# Patient Record
Sex: Female | Born: 1960 | Race: White | Hispanic: No | Marital: Married | Smoking: Never smoker
Health system: Southern US, Community
[De-identification: ages and names within clinical notes are randomized; demographics above are authoritative.]

## PROBLEM LIST (undated history)

## (undated) DIAGNOSIS — G4700 Insomnia, unspecified: Secondary | ICD-10-CM

## (undated) DIAGNOSIS — E559 Vitamin D deficiency, unspecified: Secondary | ICD-10-CM

## (undated) DIAGNOSIS — J309 Allergic rhinitis, unspecified: Secondary | ICD-10-CM

## (undated) DIAGNOSIS — M1990 Unspecified osteoarthritis, unspecified site: Secondary | ICD-10-CM

## (undated) DIAGNOSIS — R5383 Other fatigue: Secondary | ICD-10-CM

## (undated) DIAGNOSIS — K219 Gastro-esophageal reflux disease without esophagitis: Secondary | ICD-10-CM

## (undated) DIAGNOSIS — E66813 Obesity, class 3: Secondary | ICD-10-CM

## (undated) DIAGNOSIS — G43909 Migraine, unspecified, not intractable, without status migrainosus: Secondary | ICD-10-CM

## (undated) DIAGNOSIS — I499 Cardiac arrhythmia, unspecified: Secondary | ICD-10-CM

## (undated) DIAGNOSIS — I10 Essential (primary) hypertension: Secondary | ICD-10-CM

## (undated) DIAGNOSIS — J45909 Unspecified asthma, uncomplicated: Secondary | ICD-10-CM

## (undated) DIAGNOSIS — N1831 Chronic kidney disease, stage 3a: Secondary | ICD-10-CM

## (undated) DIAGNOSIS — Z6841 Body Mass Index (BMI) 40.0 and over, adult: Secondary | ICD-10-CM

## (undated) DIAGNOSIS — M109 Gout, unspecified: Secondary | ICD-10-CM

## (undated) HISTORY — DX: Unspecified asthma, uncomplicated: J45.909

## (undated) HISTORY — DX: Migraine, unspecified, not intractable, without status migrainosus: G43.909

## (undated) HISTORY — DX: Insomnia, unspecified: G47.00

## (undated) HISTORY — PX: BRAIN SURGERY: SHX531

## (undated) HISTORY — DX: Essential (primary) hypertension: I10

## (undated) HISTORY — DX: Cardiac arrhythmia, unspecified: I49.9

## (undated) HISTORY — DX: Gastro-esophageal reflux disease without esophagitis: K21.9

## (undated) HISTORY — DX: Vitamin D deficiency, unspecified: E55.9

## (undated) HISTORY — DX: Allergic rhinitis, unspecified: J30.9

## (undated) HISTORY — DX: Unspecified osteoarthritis, unspecified site: M19.90

---

## 2001-08-15 ENCOUNTER — Other Ambulatory Visit: Admission: RE | Admit: 2001-08-15 | Discharge: 2001-08-15 | Payer: Self-pay | Admitting: Family Medicine

## 2005-05-23 ENCOUNTER — Emergency Department: Payer: Self-pay | Admitting: Unknown Physician Specialty

## 2006-07-04 IMAGING — CT CT ABD-PELV W/O CM
1 of 2 series · 15 of 32 positions shown, 19 images · non-contrast
Comparison: none

REASON FOR EXAM: (1) pain and vomiting  rm 14; (2) pain and vomiting  rm 14
COMMENTS:

[Series 2: stone · axial · 0.77mm/px · z∈[-522,-114]mm · 15 of 153 slices shown, 19 images]
[im 11/153  soft-tissue]
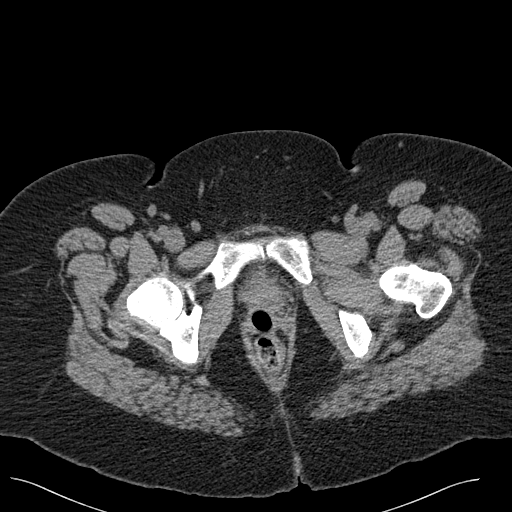
[im 11/153  bone]
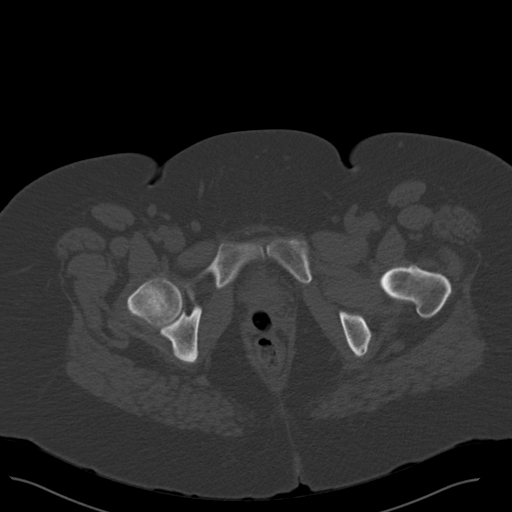
[im 22/153  soft-tissue]
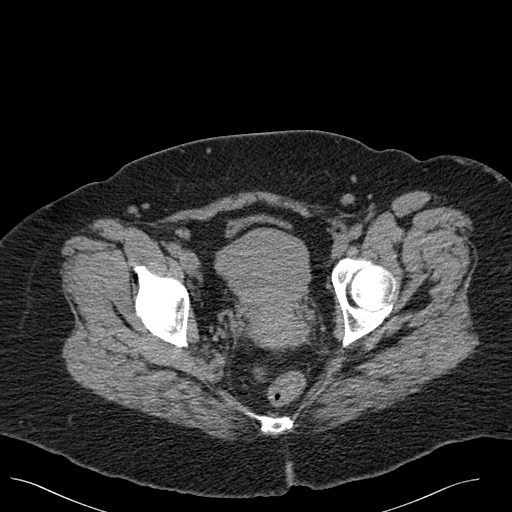
[im 33/153  soft-tissue]
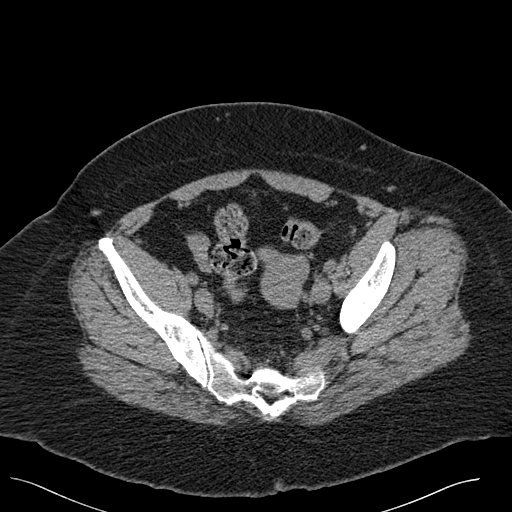
[im 44/153  soft-tissue]
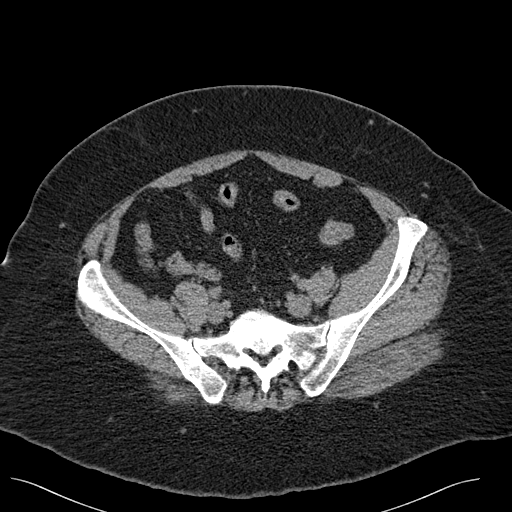
[im 55/153  soft-tissue]
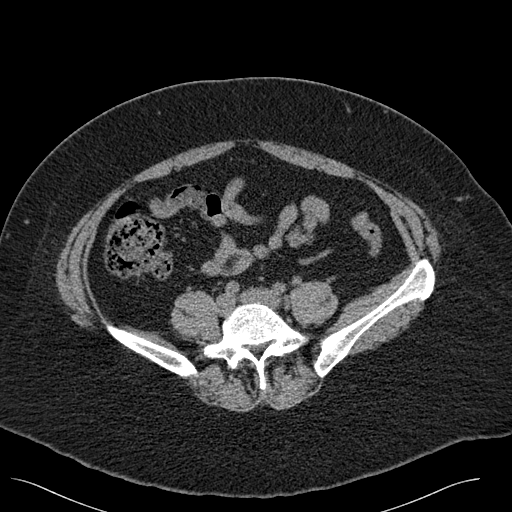
[im 66/153  soft-tissue]
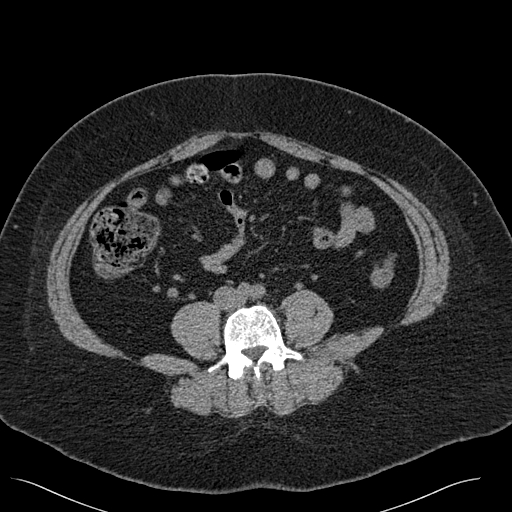
[im 77/153  soft-tissue]
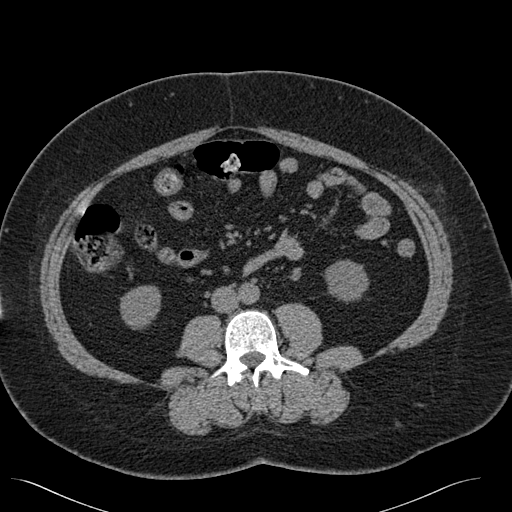
[im 87/153  soft-tissue]
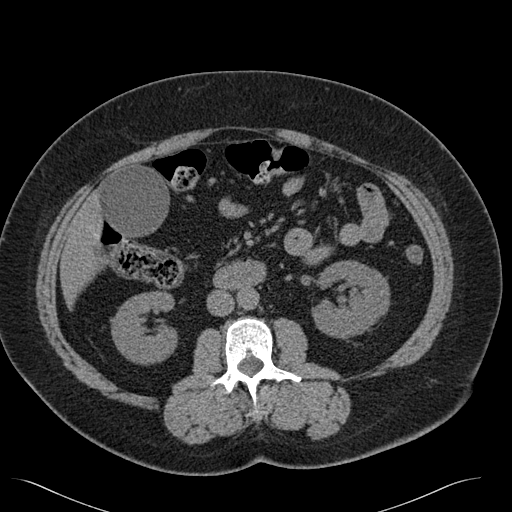
[im 98/153  soft-tissue]
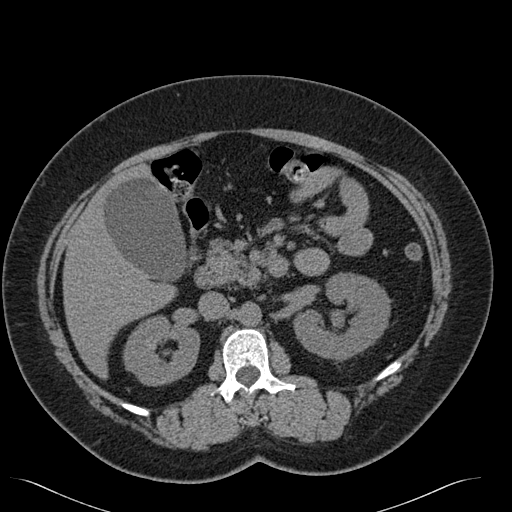
[im 98/153  bone]
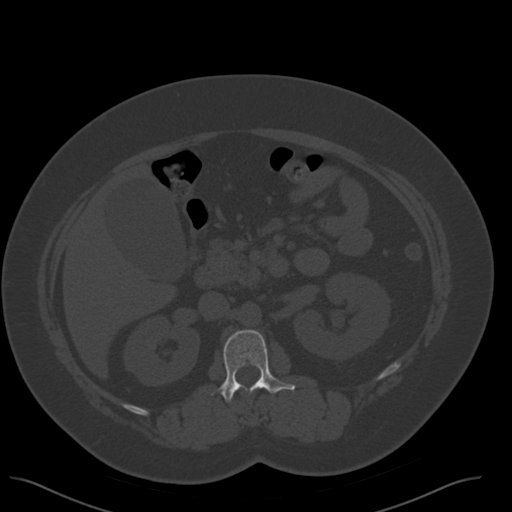
[im 109/153  soft-tissue]
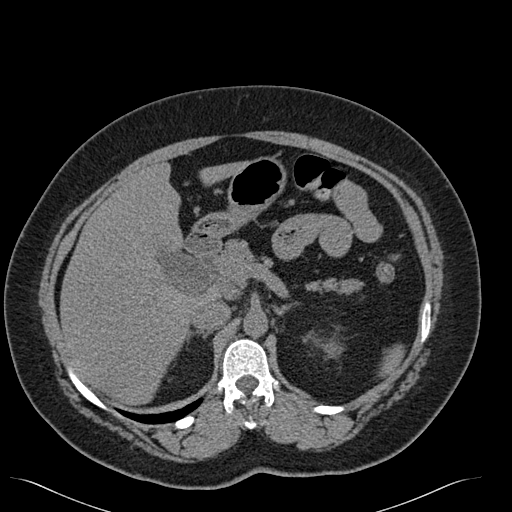
[im 120/153  soft-tissue]
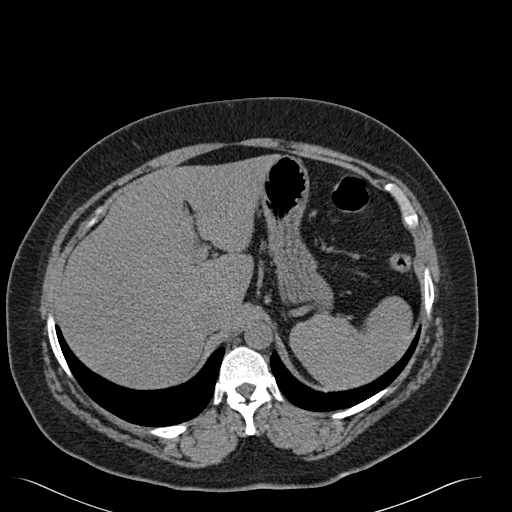
[im 131/153  soft-tissue]
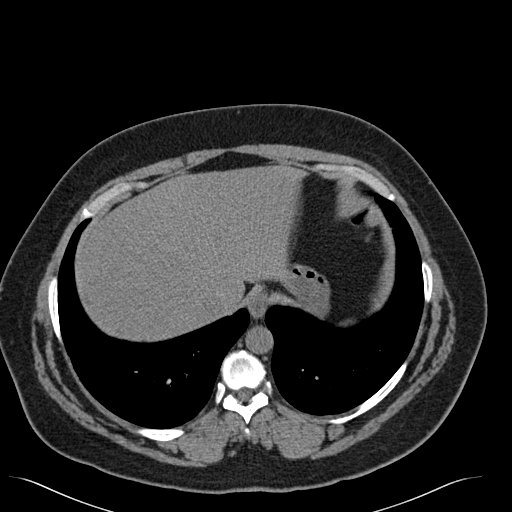
[im 131/153  lung]
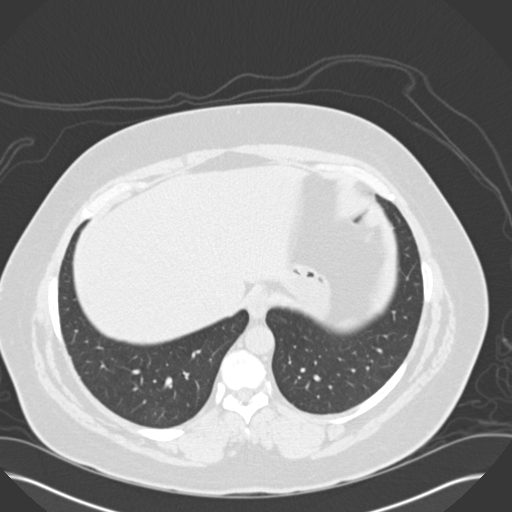
[im 136/153  lung]
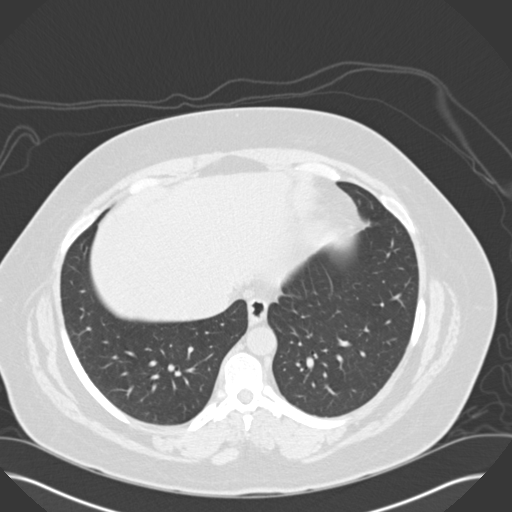
[im 142/153  soft-tissue]
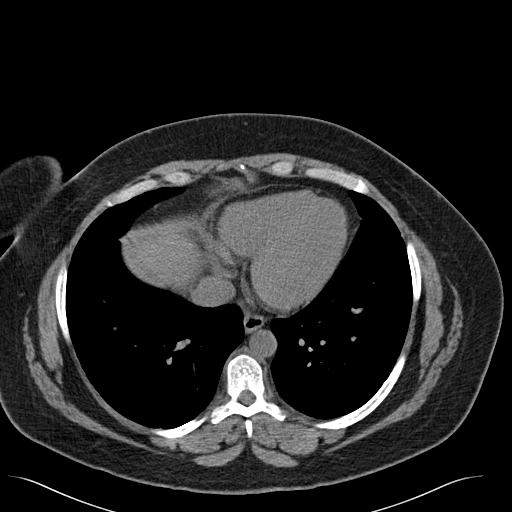
[im 142/153  lung]
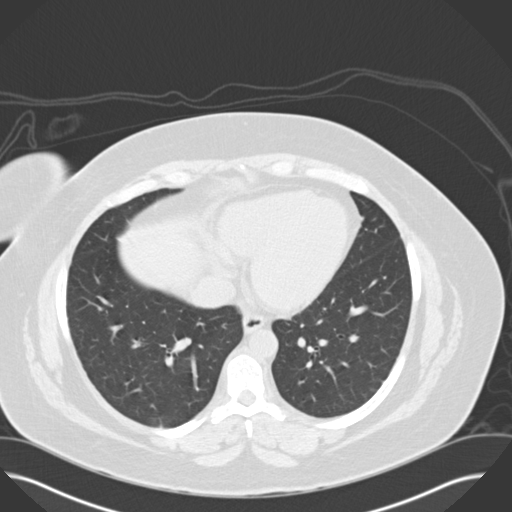
[im 147/153  lung]
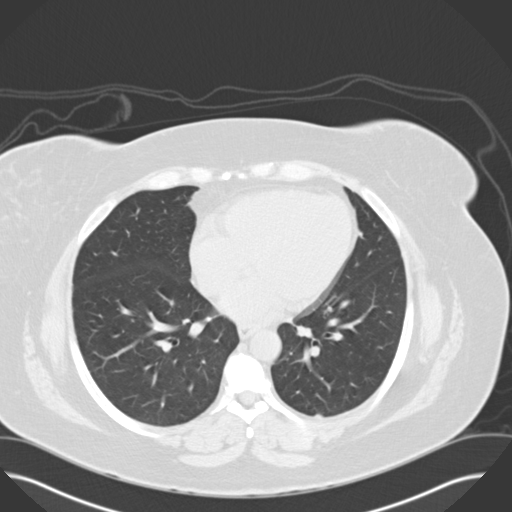

[15 of 32 positions shown; findings below may reference images not displayed]

PROCEDURE:     CT  - CT ABDOMEN AND PELVIS W[DATE]  [DATE]

RESULT:     The patient is being evaluated for possible urinary tract stones
on the LEFT.  The patient received no oral or IV contrast material.

The LEFT kidney exhibits mild hydronephrosis.  There is a tiny calcification
associated with the distal LEFT ureter seen best on image #135 and #136.
This may, in fact, reflect 2 tiny stones.  This stone lies approximately
cm proximal to the ureterovesical junction.  The RIGHT kidney exhibits no
hydronephrosis.

The liver, spleen, stomach, adrenal glands, and pancreas are normal in
appearance.  There is subtle radiodensity in the dependent portion of the
gallbladder which may reflect radiodense bile or noncalcified stones.  This
merits follow up.  The caliber of the abdominal aorta is normal.  The
unopacified loops of small and large bowel are normal in appearance.  The
uterus and adnexal structures are grossly normal.  The lung bases are clear.
IMPRESSION: 1.     There is mild hydronephrosis and hydroureter, secondary to at least
one tiny stone measuring no more than 2 mm in diameter in the distal ureter
just proximal to the ureterovesical junction.
2.     There are findings most compatible with noncalcified gallstones
present.  This could be verified with ultrasound.
3.     Not mentioned above is a hypodense focus in the mid pole of the LEFT
kidney, most compatible with an angiomyolipoma.
4.     A preliminary report was sent to the emergency room at the conclusion
of the study.

## 2011-06-26 ENCOUNTER — Ambulatory Visit: Payer: Self-pay | Admitting: Internal Medicine

## 2011-06-26 LAB — RAPID INFLUENZA A&B ANTIGENS

## 2011-07-03 ENCOUNTER — Ambulatory Visit: Payer: Self-pay | Admitting: Family Medicine

## 2011-09-13 ENCOUNTER — Ambulatory Visit: Payer: Self-pay | Admitting: Cardiovascular Disease

## 2013-05-29 ENCOUNTER — Ambulatory Visit: Payer: Self-pay | Admitting: Family Medicine

## 2014-11-30 IMAGING — CR DG CHEST 2V
1 series · 3 of 3 positions shown · non-contrast
Comparison: DG CHEST 2V dated 07/03/2011

CLINICAL DATA: Cough and short of breath.

EXAM:
CHEST  2 VIEW

[Series 1: pa · 0.17mm/px · 3 of 3 slices shown]
[im 1/3]
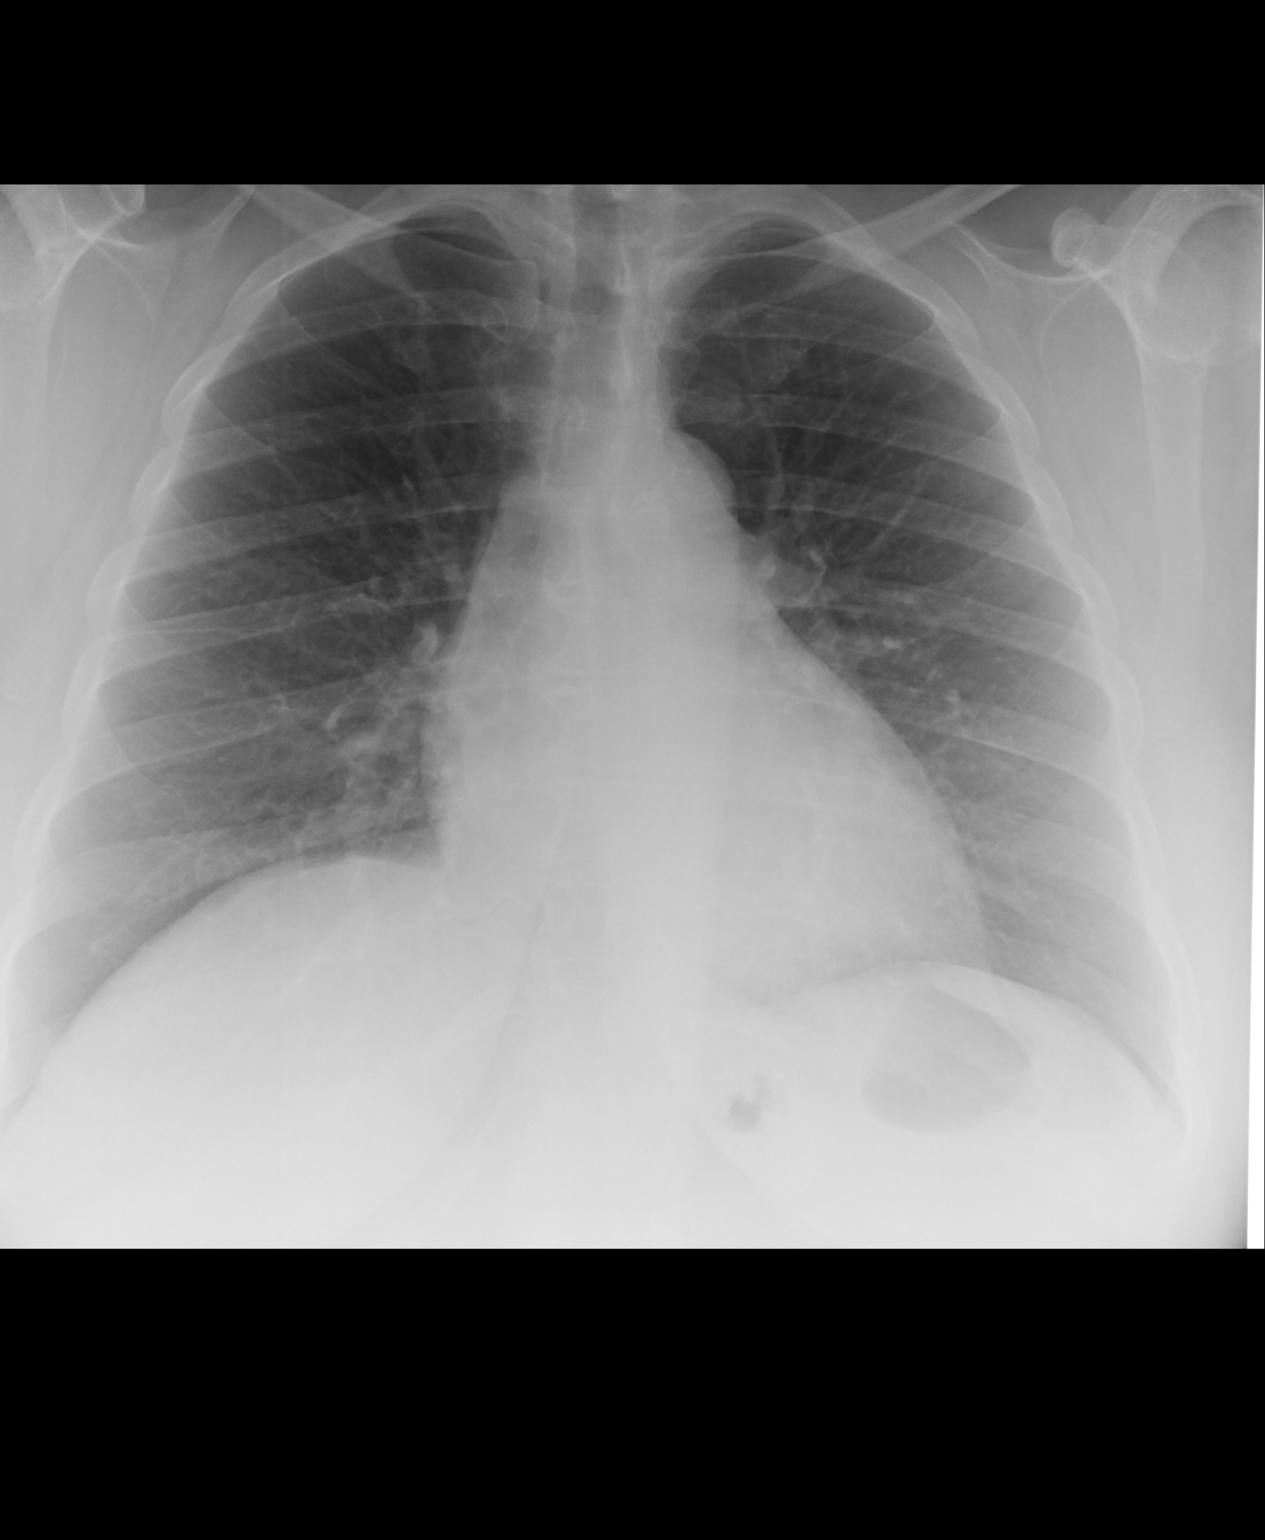
[im 2/3]
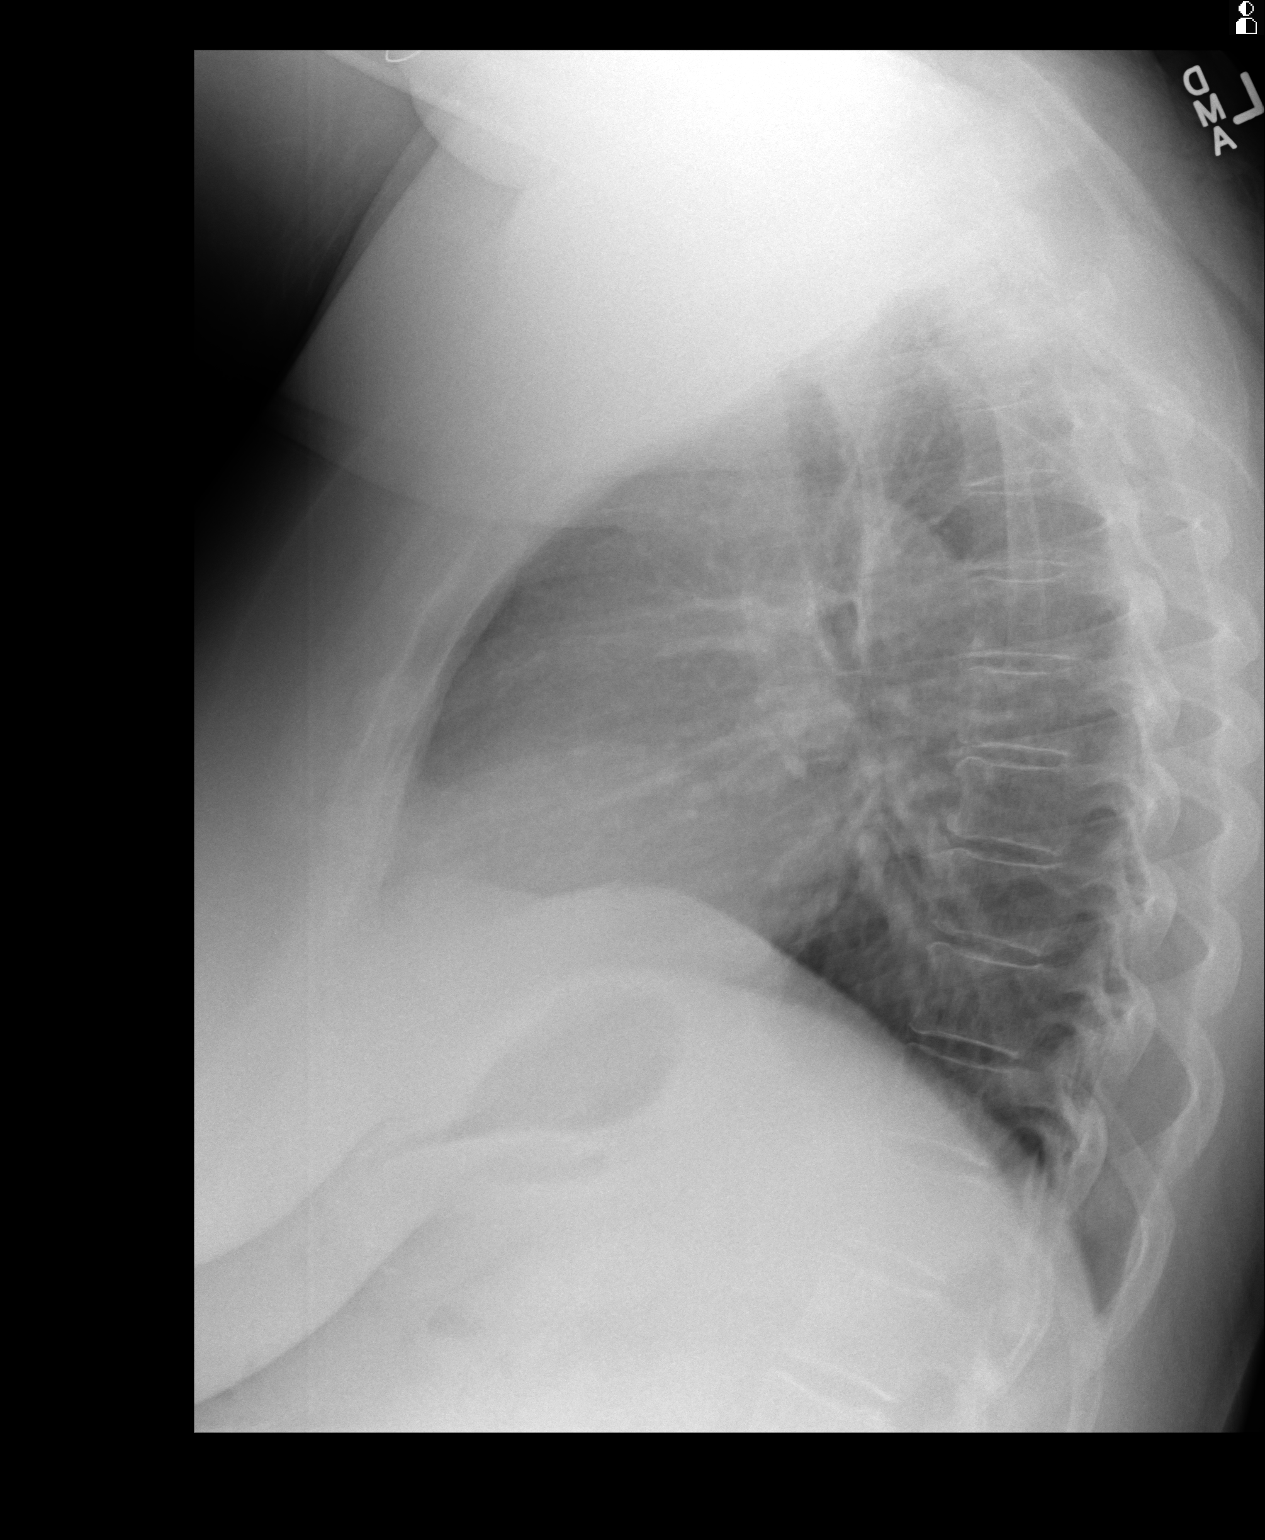
[im 3/3]
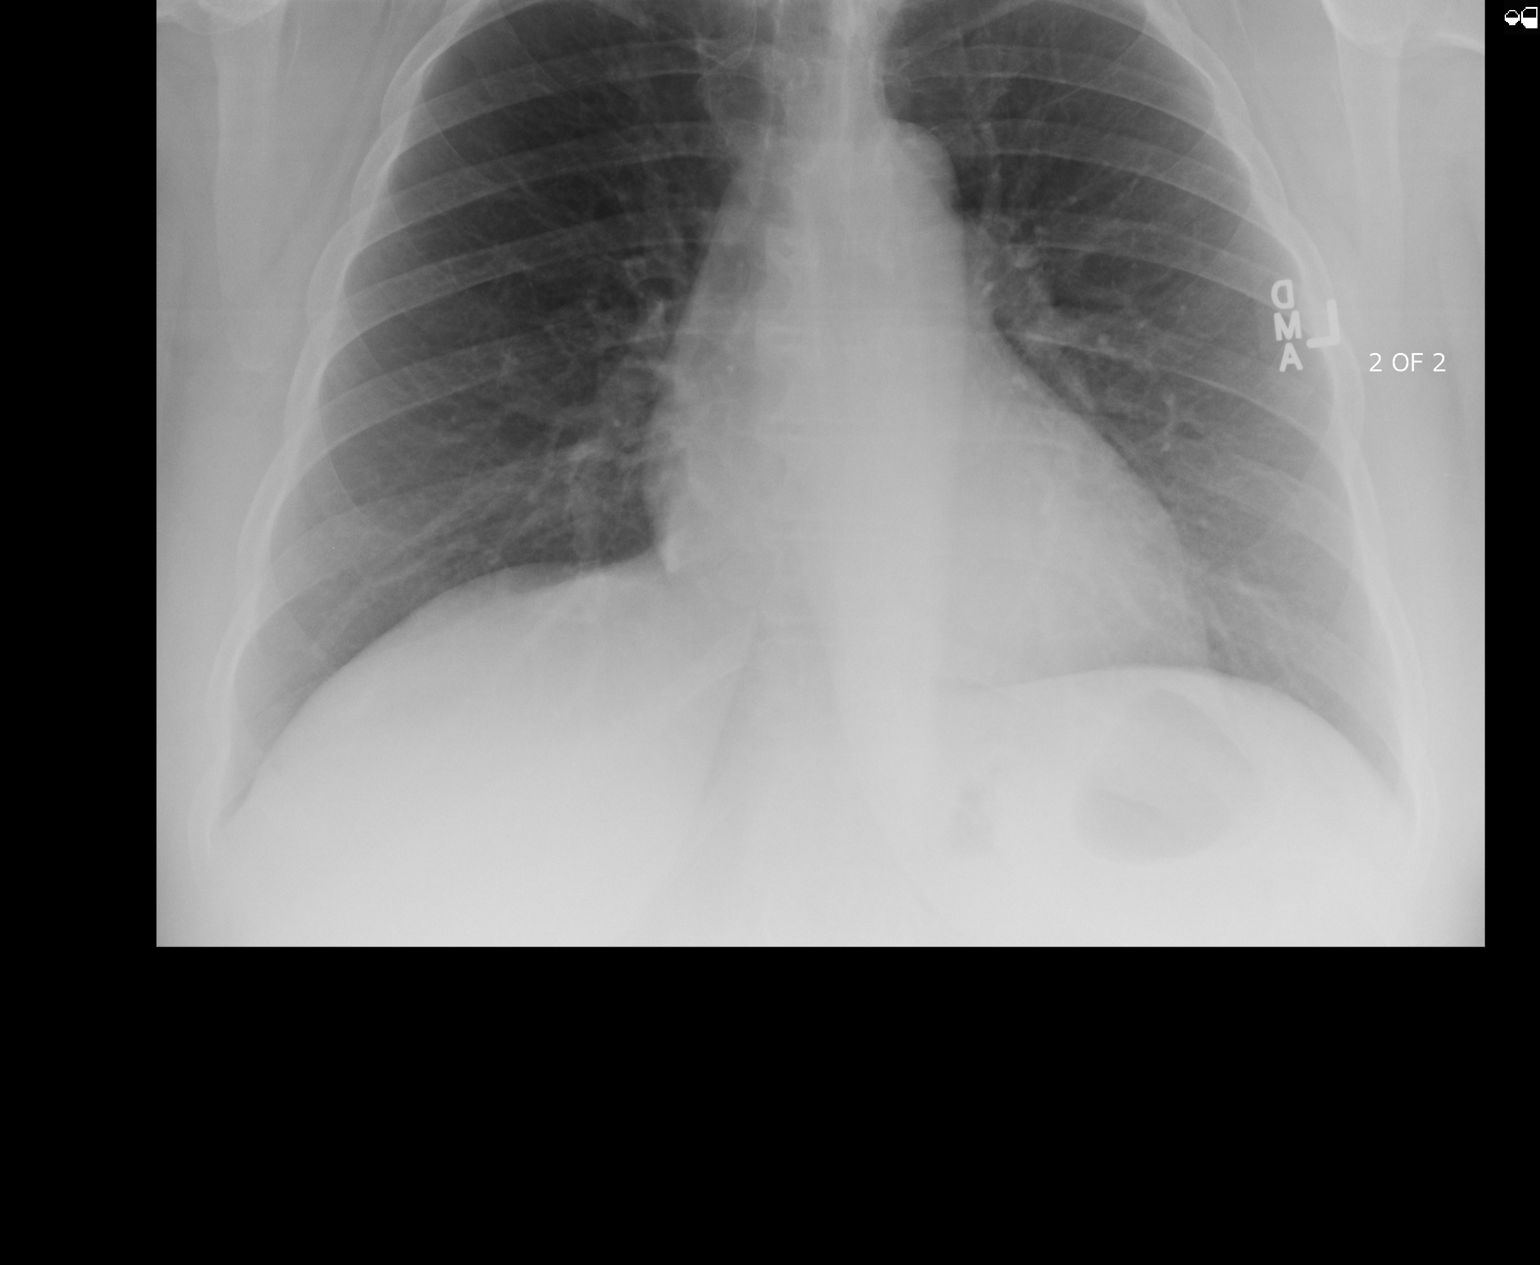

[3 of 3 positions shown; findings below may reference images not displayed]

FINDINGS: Cardiopericardial silhouette within normal limits. Mediastinal
contours normal. Trachea midline. No airspace disease or effusion.
IMPRESSION: No active cardiopulmonary disease.

## 2015-02-15 ENCOUNTER — Other Ambulatory Visit: Payer: Self-pay

## 2015-02-15 DIAGNOSIS — K219 Gastro-esophageal reflux disease without esophagitis: Secondary | ICD-10-CM | POA: Insufficient documentation

## 2015-02-15 DIAGNOSIS — I10 Essential (primary) hypertension: Secondary | ICD-10-CM | POA: Insufficient documentation

## 2015-02-15 MED ORDER — TRIAMTERENE-HCTZ 75-50 MG PO TABS: 1.0000 | ORAL_TABLET | Freq: Every day | ORAL | Status: DC

## 2015-02-15 NOTE — Telephone Encounter (Signed)
Last OV 07/2013  Thanks,   -Jakiah Goree  

## 2015-02-15 NOTE — Addendum Note (Signed)
Addended by: Kavin Leech E on: 02/15/2015 09:52 AM   Modules accepted: Orders

## 2015-02-17 ENCOUNTER — Other Ambulatory Visit: Payer: Self-pay

## 2015-02-17 DIAGNOSIS — I10 Essential (primary) hypertension: Secondary | ICD-10-CM

## 2015-02-17 MED ORDER — TRIAMTERENE-HCTZ 75-50 MG PO TABS: 1.0000 | ORAL_TABLET | Freq: Every day | ORAL | Status: DC

## 2015-05-31 ENCOUNTER — Ambulatory Visit (INDEPENDENT_AMBULATORY_CARE_PROVIDER_SITE_OTHER): Payer: BC Managed Care – PPO | Admitting: Family Medicine

## 2015-05-31 ENCOUNTER — Encounter: Payer: Self-pay | Admitting: Family Medicine

## 2015-05-31 VITALS — BP 128/108 | HR 98 | Temp 98.3°F | Resp 16 | Wt 247.2 lb

## 2015-05-31 DIAGNOSIS — I10 Essential (primary) hypertension: Secondary | ICD-10-CM | POA: Diagnosis not present

## 2015-05-31 DIAGNOSIS — J45909 Unspecified asthma, uncomplicated: Secondary | ICD-10-CM | POA: Insufficient documentation

## 2015-05-31 DIAGNOSIS — M1990 Unspecified osteoarthritis, unspecified site: Secondary | ICD-10-CM | POA: Insufficient documentation

## 2015-05-31 DIAGNOSIS — G4700 Insomnia, unspecified: Secondary | ICD-10-CM | POA: Insufficient documentation

## 2015-05-31 DIAGNOSIS — N912 Amenorrhea, unspecified: Secondary | ICD-10-CM | POA: Insufficient documentation

## 2015-05-31 DIAGNOSIS — E669 Obesity, unspecified: Secondary | ICD-10-CM

## 2015-05-31 DIAGNOSIS — J309 Allergic rhinitis, unspecified: Secondary | ICD-10-CM | POA: Insufficient documentation

## 2015-05-31 DIAGNOSIS — Z87442 Personal history of urinary calculi: Secondary | ICD-10-CM | POA: Insufficient documentation

## 2015-05-31 DIAGNOSIS — J029 Acute pharyngitis, unspecified: Secondary | ICD-10-CM

## 2015-05-31 DIAGNOSIS — B349 Viral infection, unspecified: Secondary | ICD-10-CM

## 2015-05-31 DIAGNOSIS — G43909 Migraine, unspecified, not intractable, without status migrainosus: Secondary | ICD-10-CM | POA: Insufficient documentation

## 2015-05-31 DIAGNOSIS — Z8679 Personal history of other diseases of the circulatory system: Secondary | ICD-10-CM | POA: Insufficient documentation

## 2015-05-31 LAB — POC INFLUENZA A&B (BINAX/QUICKVUE)
Influenza A, POC: NEGATIVE
Influenza B, POC: NEGATIVE

## 2015-05-31 LAB — POCT RAPID STREP A (OFFICE): Rapid Strep A Screen: NEGATIVE

## 2015-05-31 MED ORDER — TRIAMTERENE-HCTZ 75-50 MG PO TABS: 1.0000 | ORAL_TABLET | Freq: Every day | ORAL | Status: DC

## 2015-05-31 MED ORDER — HYDROCODONE-HOMATROPINE 5-1.5 MG/5ML PO SYRP: ORAL_SOLUTION | ORAL | Status: DC

## 2015-05-31 NOTE — Patient Instructions (Signed)
May use Mucinex expectorant, Claritin for nasal drip, saline nasal spray, and Delsym for cough syrup that is non-sedating.

## 2015-05-31 NOTE — Progress Notes (Signed)
Subjective:     Patient ID: Brittney Sullivan, female   DOB: Jan 09, 1961, 55 y.o.   MRN: [id number]  HPI  Chief Complaint  Patient presents with  . Cough    Patient comes in office today with concerns of cough and congestion, patient states symptoms started off as a sore throat . Patient reports fever, body aches, chills, decreased appetite, runny nose and soreness in her left ear. Patient has not taken any otc medication to relieve symptoms  States she had the flu vaccine this year. Reports compliance with her bp medication.   Review of Systems     Objective:   Physical Exam  Constitutional: She appears well-developed and well-nourished. No distress.  Ears: T.M's intact without inflammatio Throat: no tonsillar enlargement or exudate; mild erythema Neck: no cervical adenopathy Lungs: clear     Assessment:    1. Sore throat - POCT rapid strep A   2. Viral syndrome - POC Influenza A&B - HYDROcodone-homatropine (HYCODAN) 5-1.5 MG/5ML syrup; 5 ml 4-6 hours as needed for cough  Dispense: 240 mL; Refill: 0  3. Essential hypertension - triamterene-hydrochlorothiazide (MAXZIDE) 75-50 MG tablet; Take 1 tablet by mouth daily.  Dispense: 30 tablet; Refill: 0    Plan:    work excuse for 1/17-1/20. Discussed otc medication that won't affect her bp. F/u htn in 3 weeks.

## 2015-06-24 ENCOUNTER — Encounter: Payer: Self-pay | Admitting: Family Medicine

## 2015-06-24 ENCOUNTER — Ambulatory Visit (INDEPENDENT_AMBULATORY_CARE_PROVIDER_SITE_OTHER): Payer: BC Managed Care – PPO | Admitting: Family Medicine

## 2015-06-24 VITALS — BP 102/86 | HR 86 | Temp 97.8°F | Resp 16 | Wt 250.6 lb

## 2015-06-24 DIAGNOSIS — I10 Essential (primary) hypertension: Secondary | ICD-10-CM

## 2015-06-24 DIAGNOSIS — E669 Obesity, unspecified: Secondary | ICD-10-CM | POA: Diagnosis not present

## 2015-06-24 DIAGNOSIS — Z13220 Encounter for screening for lipoid disorders: Secondary | ICD-10-CM | POA: Diagnosis not present

## 2015-06-24 MED ORDER — TRIAMTERENE-HCTZ 75-50 MG PO TABS: 1.0000 | ORAL_TABLET | Freq: Every day | ORAL | Status: DC

## 2015-06-24 MED ORDER — PHENTERMINE HCL 37.5 MG PO CAPS: 37.5000 mg | ORAL_CAPSULE | ORAL | Status: DC

## 2015-06-24 NOTE — Patient Instructions (Signed)
Don't forget to get your fasting labs drawn next week.

## 2015-06-24 NOTE — Progress Notes (Signed)
Subjective:     Patient ID: Brittney Sullivan, female   DOB: 1960-07-06, 55 y.o.   MRN: [id number]  HPI  Chief Complaint  Patient presents with  . Hypertension    Patient is present in office today for her 3 week follow up. Last office visit 05/31/15 patient was place on Maxide 75-50mg  qd, blood pressure in house at visit was 128/108. Patient reports good compliance, tolerance and symptom control while on medication.   Also wishes medication to help with weight loss. Current exercise is to walk all day in her job at [employer]. Has been on appetite suppressants years ago.   Review of Systems  Respiratory: Negative for shortness of breath.   Cardiovascular: Negative for chest pain and palpitations.       Objective:   Physical Exam  Constitutional: She appears well-developed and well-nourished. No distress.  Cardiovascular: Normal rate and regular rhythm.   Pulmonary/Chest: Breath sounds normal.  Musculoskeletal: She exhibits no edema (of lower extremities).       Assessment:    1. Essential hypertension - Comprehensive metabolic panel - triamterene-hydrochlorothiazide (MAXZIDE) 75-50 MG tablet; Take 1 tablet by mouth daily.  Dispense: 90 tablet; Refill: 3  2. Screening for cholesterol level - Lipid panel  3. Obesit - phentermine 37.5 MG capsule; Take 1 capsule (37.5 mg total) by mouth every morning.  Dispense: 30 capsule; Refill: 0    Plan:    F/u in 4 weeks/labs pending.

## 2015-06-30 ENCOUNTER — Telehealth: Payer: Self-pay

## 2015-06-30 LAB — COMPREHENSIVE METABOLIC PANEL
ALBUMIN: 4.4 g/dL (ref 3.5–5.5)
ALK PHOS: 96 IU/L (ref 39–117)
ALT: 19 IU/L (ref 0–32)
AST: 17 IU/L (ref 0–40)
Albumin/Globulin Ratio: 1.3 (ref 1.1–2.5)
BUN / CREAT RATIO: 18 (ref 9–23)
BUN: 16 mg/dL (ref 6–24)
Bilirubin Total: 0.4 mg/dL (ref 0.0–1.2)
CO2: 26 mmol/L (ref 18–29)
CREATININE: 0.89 mg/dL (ref 0.57–1.00)
Calcium: 10 mg/dL (ref 8.7–10.2)
Chloride: 100 mmol/L (ref 96–106)
GFR calc Af Amer: 85 mL/min/{1.73_m2} (ref >59)
GFR calc non Af Amer: 74 mL/min/{1.73_m2} (ref >59)
GLUCOSE: 101 mg/dL — AB (ref 65–99)
Globulin, Total: 3.4 g/dL (ref 1.5–4.5)
Potassium: 4.4 mmol/L (ref 3.5–5.2)
Sodium: 141 mmol/L (ref 134–144)
TOTAL PROTEIN: 7.8 g/dL (ref 6.0–8.5)

## 2015-06-30 LAB — LIPID PANEL
CHOLESTEROL TOTAL: 167 mg/dL (ref 100–199)
Chol/HDL Ratio: 2.5 ratio units (ref 0.0–4.4)
HDL: 66 mg/dL (ref >39)
LDL CALC: 84 mg/dL (ref 0–99)
Triglycerides: 85 mg/dL (ref 0–149)
VLDL CHOLESTEROL CAL: 17 mg/dL (ref 5–40)

## 2015-06-30 NOTE — Telephone Encounter (Signed)
[id number] 

## 2015-06-30 NOTE — Telephone Encounter (Signed)
-----   Message from Anola Gurney, [place] sent at 06/30/2015  7:04 AM EST ----- Labs look good except for minimal elevation of sugar. This should resolve with increased exercise and weight loss.

## 2015-07-11 NOTE — Telephone Encounter (Signed)
[id number] 

## 2015-07-13 NOTE — Telephone Encounter (Signed)
[id number] 

## 2015-07-18 NOTE — Telephone Encounter (Signed)
[id number] 

## 2015-07-20 NOTE — Telephone Encounter (Signed)
Patient has not returned call after multiple attempts, will mail letter home. KW

## 2015-07-22 ENCOUNTER — Ambulatory Visit (INDEPENDENT_AMBULATORY_CARE_PROVIDER_SITE_OTHER): Payer: BC Managed Care – PPO | Admitting: Family Medicine

## 2015-07-22 ENCOUNTER — Encounter: Payer: Self-pay | Admitting: Family Medicine

## 2015-07-22 VITALS — BP 132/72 | HR 76 | Temp 98.2°F | Resp 16 | Wt 244.8 lb

## 2015-07-22 DIAGNOSIS — E669 Obesity, unspecified: Secondary | ICD-10-CM | POA: Diagnosis not present

## 2015-07-22 DIAGNOSIS — F439 Reaction to severe stress, unspecified: Secondary | ICD-10-CM

## 2015-07-22 DIAGNOSIS — Z658 Other specified problems related to psychosocial circumstances: Secondary | ICD-10-CM | POA: Diagnosis not present

## 2015-07-22 MED ORDER — CLONAZEPAM 0.5 MG PO TABS
ORAL_TABLET | ORAL | Status: DC
Start: 2015-07-22 — End: 2019-01-08

## 2015-07-22 MED ORDER — PHENTERMINE HCL 37.5 MG PO CAPS: 37.5000 mg | ORAL_CAPSULE | ORAL | Status: DC

## 2015-07-22 NOTE — Progress Notes (Signed)
Subjective:     Patient ID: Brittney Sullivan, female   DOB: 1960/11/03, 55 y.o.   MRN: [id number]  HPI  Chief Complaint  Patient presents with  . Hypertension    Follow up from 06/24/15 at last office visit blood pressure in house was 102/86 patient was advised to continue Triamterene-HCTZ 75/50mg . Patient reports good compliance,tolerance and symptom control  . Obesity    Patient returns back to office for follow up, last office visit was 06/24/15 patient was started on Phentermine 37.5mg . Patient reports good compliance only side effect she complains of is dry mouth  . Insomnia    Patient reports that she has been under a lot of stress at work with new boss and states that she is having a hard time sleeping. Patient reports that she sleeps on avg <5hrs a night  Wishes to stay on phentermine despite dry mouth. Weight decreased 6#. States she walks a lot at work and is doing a remodeling project at home for her current exercise level. States "I can't please my boss." Has been on Xanax in the past at the time of a family member's death.   Review of Systems     Objective:   Physical Exam  Constitutional: She appears well-developed and well-nourished. No distress.  Psychiatric: She has a normal mood and affect. Her behavior is normal.       Assessment:    1. Obesity - phentermine 37.5 MG capsule; Take 1 capsule (37.5 mg total) by mouth every morning.  Dispense: 30 capsule; Refill: 0  2. Situational stress - clonazePAM (KLONOPIN) 0.5 MG tablet; Take one or two twice daily as needed for stress/insomnia  Dispense: 30 tablet; Refill: 1    Plan:   Discussed exercise and sleep promotion tips.

## 2015-07-22 NOTE — Patient Instructions (Addendum)
Continue with exercise efforts. Try 30 minutes of continuous walking daily.1.Keep the same bedtime every night 2.Use your bed for sleep and sex only 3. Avoid exercise or stimulating electronic activity (video games/smart phones) prior to bedtime 4.Avoid alcohol or caffeine 2-3 hours before bedtime 5.Take a bath/shower prior to bedtime. This will help cool your body down and prepare it for sleep. 6.If you can't sleep, get up and do a sleep inducing activity like reading,

## 2015-08-19 ENCOUNTER — Ambulatory Visit (INDEPENDENT_AMBULATORY_CARE_PROVIDER_SITE_OTHER): Payer: BC Managed Care – PPO | Admitting: Family Medicine

## 2015-08-19 ENCOUNTER — Encounter: Payer: Self-pay | Admitting: Family Medicine

## 2015-08-19 VITALS — BP 128/88 | HR 88 | Temp 98.0°F | Resp 18 | Ht 60.0 in | Wt 246.0 lb

## 2015-08-19 DIAGNOSIS — E669 Obesity, unspecified: Secondary | ICD-10-CM

## 2015-08-19 MED ORDER — PHENTERMINE HCL 37.5 MG PO CAPS: 37.5000 mg | ORAL_CAPSULE | ORAL | Status: DC

## 2015-08-19 NOTE — Patient Instructions (Signed)
Try to start a walking program of 30 minutes daily.

## 2015-08-19 NOTE — Progress Notes (Signed)
Subjective:     Patient ID: Brittney Sullivan, female   DOB: July 20, 1960, 55 y.o.   MRN: [id number]  HPI  Chief Complaint  Patient presents with  . Obesity  States she is not sure why her weight is up 2# from last month. She states she does not eat much. Continues to have job stress and feels the clonazepam at a whole pill fatigued her. Encouraged her to try 1/2 pill. She is completing a home remodeling project in the next week and should have more time for exercise.   Review of Systems  Respiratory: Negative for shortness of breath.   Cardiovascular: Negative for chest pain and palpitations.       Objective:   Physical Exam  Constitutional: She appears well-developed and well-nourished. No distress.  Psychiatric: She has a normal mood and affect. Her behavior is normal.       Assessment:    1. Obesity - phentermine 37.5 MG capsule; Take 1 capsule (37.5 mg total) by mouth every morning.  Dispense: 30 capsule; Refill: 0    Plan:    Start walking program 30 minutes daily. Consider trial on different appetite suppressant.

## 2015-09-16 ENCOUNTER — Ambulatory Visit: Payer: BC Managed Care – PPO | Admitting: Family Medicine

## 2018-03-13 ENCOUNTER — Encounter: Payer: Self-pay | Admitting: Family Medicine

## 2018-03-13 ENCOUNTER — Ambulatory Visit (INDEPENDENT_AMBULATORY_CARE_PROVIDER_SITE_OTHER): Payer: BC Managed Care – PPO | Admitting: Family Medicine

## 2018-03-13 VITALS — BP 158/90 | HR 83 | Temp 97.8°F | Resp 16 | Wt 263.2 lb

## 2018-03-13 DIAGNOSIS — I10 Essential (primary) hypertension: Secondary | ICD-10-CM

## 2018-03-13 DIAGNOSIS — H1032 Unspecified acute conjunctivitis, left eye: Secondary | ICD-10-CM

## 2018-03-13 MED ORDER — SULFACETAMIDE SODIUM 10 % OP SOLN: OPHTHALMIC | 0 refills | Status: DC

## 2018-03-13 MED ORDER — TRIAMTERENE-HCTZ 75-50 MG PO TABS: 1.0000 | ORAL_TABLET | Freq: Every day | ORAL | 0 refills | Status: DC

## 2018-03-13 NOTE — Patient Instructions (Addendum)
Discusses use of warm compresses several x day to left eye. Resume bp medication.

## 2018-03-13 NOTE — Progress Notes (Addendum)
Subjective:     Patient ID: Waldon Reining, female   DOB: 04-Jul-1960, 57 y.o.   MRN: [id number] Chief Complaint  Patient presents with  . Eye Drainage    Patient c/o left eye drainage since Saturday. She reports on Saturday it was pink. She woke up this morning and was not able to open her eye.She has cleaned it with warmed watch cloth.   HPI States vision is mildly blurry and her eye is sore and sensitive to light. No contact use. Also lost to f/u for HTN. She has been off medication for a year. Willing to resume medication. 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  Review of Systems     Objective:   Physical Exam  Constitutional: She appears well-developed and well-nourished. No distress.  HENT:  Left sclera injected with small amount of discharge @ medial canthus. Mild swelling of upper eyelid. V.A. Is intact ot # of fingers.  Eyes: Pupils are equal, round, and reactive to light.       Assessment:    1. Essential hypertension - triamterene-hydrochlorothiazide (MAXZIDE) 75-50 MG tablet; Take 1 tablet by mouth daily.  Dispense: 90 tablet; Refill: 0  2. Acute bacterial conjunctivitis of left eye - sulfacetamide (BLEPH-10) 10 % ophthalmic solution; Two drops in left eye 4 x day.  Dispense: 15 mL; Refill: 0    Plan:    Discussed use of warm compresses. F/u bp after her Thanksgiving vacation.

## 2018-04-18 ENCOUNTER — Ambulatory Visit: Payer: BC Managed Care – PPO | Admitting: Family Medicine

## 2018-04-18 ENCOUNTER — Encounter: Payer: Self-pay | Admitting: Family Medicine

## 2018-04-18 VITALS — BP 140/100 | HR 110 | Temp 98.1°F | Ht 60.0 in | Wt 261.8 lb

## 2018-04-18 DIAGNOSIS — Z13220 Encounter for screening for lipoid disorders: Secondary | ICD-10-CM

## 2018-04-18 DIAGNOSIS — I10 Essential (primary) hypertension: Secondary | ICD-10-CM | POA: Diagnosis not present

## 2018-04-18 MED ORDER — LISINOPRIL 10 MG PO TABS: 10.0000 mg | ORAL_TABLET | Freq: Every day | ORAL | 0 refills | Status: DC

## 2018-04-18 NOTE — Patient Instructions (Addendum)
Please get the labs fasting and we will cal with the results. Consider  scheduling a mammogram this year and update your pap smear next year.

## 2018-04-18 NOTE — Progress Notes (Signed)
Subjective:     Patient ID: Brittney Sullivan, female   DOB: Sep 28, 1960, 57 y.o.   MRN: [id number] Chief Complaint  Patient presents with  . Hypertension    5 week fup   HPI Reports compliance with HCTZ. She usually gets mammograms at [facility] and encouraged her to update.  Pap is due next year.  Review of Systems     Objective:   Physical Exam  Constitutional: She appears well-developed and well-nourished. No distress.  Cardiovascular: Normal rate and regular rhythm.  Pulmonary/Chest: Breath sounds normal.  Musculoskeletal: She exhibits no edema (of distal lower extremities).       Assessment:    1. Essential hypertension - lisinopril (PRINIVIL,ZESTRIL) 10 MG tablet; Take 1 tablet (10 mg total) by mouth daily.  Dispense: 30 tablet; Refill: 0 - Comprehensive metabolic panel  2. Screening for cholesterol level - Lipid panel    Plan:    Will call with lab results. Recheck in 3 weeks.

## 2018-04-23 LAB — COMPREHENSIVE METABOLIC PANEL
A/G RATIO: 1.3 (ref 1.2–2.2)
ALBUMIN: 4.4 g/dL (ref 3.5–5.5)
ALK PHOS: 98 IU/L (ref 39–117)
ALT: 20 IU/L (ref 0–32)
AST: 16 IU/L (ref 0–40)
BUN/Creatinine Ratio: 15 (ref 9–23)
BUN: 21 mg/dL (ref 6–24)
Bilirubin Total: 0.4 mg/dL (ref 0.0–1.2)
CALCIUM: 9.8 mg/dL (ref 8.7–10.2)
CHLORIDE: 100 mmol/L (ref 96–106)
CO2: 22 mmol/L (ref 20–29)
Creatinine, Ser: 1.39 mg/dL — ABNORMAL HIGH (ref 0.57–1.00)
GFR calc Af Amer: 49 mL/min/{1.73_m2} — ABNORMAL LOW (ref >59)
GFR calc non Af Amer: 42 mL/min/{1.73_m2} — ABNORMAL LOW (ref >59)
GLUCOSE: 109 mg/dL — AB (ref 65–99)
Globulin, Total: 3.5 g/dL (ref 1.5–4.5)
POTASSIUM: 4.8 mmol/L (ref 3.5–5.2)
Sodium: 140 mmol/L (ref 134–144)
Total Protein: 7.9 g/dL (ref 6.0–8.5)

## 2018-04-23 LAB — LIPID PANEL
CHOLESTEROL TOTAL: 160 mg/dL (ref 100–199)
Chol/HDL Ratio: 3.6 ratio (ref 0.0–4.4)
HDL: 44 mg/dL (ref >39)
LDL Calculated: 95 mg/dL (ref 0–99)
TRIGLYCERIDES: 104 mg/dL (ref 0–149)
VLDL Cholesterol Cal: 21 mg/dL (ref 5–40)

## 2018-05-09 ENCOUNTER — Other Ambulatory Visit: Payer: Self-pay

## 2018-05-09 ENCOUNTER — Encounter: Payer: Self-pay | Admitting: Family Medicine

## 2018-05-09 ENCOUNTER — Ambulatory Visit: Payer: BC Managed Care – PPO | Admitting: Family Medicine

## 2018-05-09 VITALS — BP 132/84 | HR 80 | Temp 97.8°F | Ht 60.0 in | Wt 262.0 lb

## 2018-05-09 DIAGNOSIS — S29012A Strain of muscle and tendon of back wall of thorax, initial encounter: Secondary | ICD-10-CM | POA: Diagnosis not present

## 2018-05-09 DIAGNOSIS — R944 Abnormal results of kidney function studies: Secondary | ICD-10-CM | POA: Diagnosis not present

## 2018-05-09 DIAGNOSIS — I10 Essential (primary) hypertension: Secondary | ICD-10-CM

## 2018-05-09 MED ORDER — CYCLOBENZAPRINE HCL 5 MG PO TABS: 5.0000 mg | ORAL_TABLET | Freq: Three times a day (TID) | ORAL | 0 refills | Status: DC | PRN

## 2018-05-09 MED ORDER — LISINOPRIL 10 MG PO TABS: 10.0000 mg | ORAL_TABLET | Freq: Every day | ORAL | 3 refills | Status: DC

## 2018-05-09 MED ORDER — TRIAMTERENE-HCTZ 75-50 MG PO TABS: 1.0000 | ORAL_TABLET | Freq: Every day | ORAL | 1 refills | Status: DC

## 2018-05-09 NOTE — Patient Instructions (Signed)
Discussed use of Tylenol up to 3000 mg/day for back discomfort. Get the labs in 3 months (March)

## 2018-05-09 NOTE — Progress Notes (Signed)
Subjective:     Patient ID: Brittney Sullivan, female   DOB: 1961/01/06, 57 y.o.   MRN: [id number] Chief Complaint  Patient presents with  . Follow-up    3 week blood pressure  . Spasms    right upper back since 05/06/18   HPI States she has been compliant and tolerating her bp medication. Reports 12/24 she developed right upper back spasms.Denies specific injury but was standing and cooking a lot over the holidays.  Review of Systems     Objective:   Physical Exam Constitutional:      General: She is not in acute distress. Cardiovascular:     Rate and Rhythm: Normal rate and regular rhythm.  Pulmonary:     Effort: No respiratory distress.     Breath sounds: Normal breath sounds.  Musculoskeletal:     Right lower leg: No edema.     Left lower leg: No edema.     Comments: Muscle strength in lower extremities 5/5. SLR's to 90 degrees without radiation of back pain. Mild tenderness over her right scapular area. No rash.  Neurological:     Mental Status: She is alert.        Assessment:    1. Essential hypertension - lisinopril (PRINIVIL,ZESTRIL) 10 MG tablet; Take 1 tablet (10 mg total) by mouth daily.  Dispense: 90 tablet; Refill: 3 - triamterene-hydrochlorothiazide (MAXZIDE) 75-50 MG tablet; Take 1 tablet by mouth daily.  Dispense: 90 tablet; Refill: 1  2. Strain of thoracic back region: rx for cyclobenzaprine  3. Decreased GFR: will have drawn in 3 months. - Renal function panel    Plan:    Further f/u pending lab work. Discussed tylenol for her back pain.

## 2018-12-15 ENCOUNTER — Other Ambulatory Visit: Payer: Self-pay | Admitting: Family Medicine

## 2018-12-15 DIAGNOSIS — I10 Essential (primary) hypertension: Secondary | ICD-10-CM

## 2018-12-15 MED ORDER — TRIAMTERENE-HCTZ 75-50 MG PO TABS: 1.0000 | ORAL_TABLET | Freq: Every day | ORAL | 0 refills | Status: DC

## 2018-12-15 NOTE — Telephone Encounter (Signed)
Bob's old patient L.O.V. was 05/09/2018 and she schedule appointment with Dr. Jacinto Reap on 01/08/2019 @ 3:40 PM. Please advise.

## 2018-12-15 NOTE — Telephone Encounter (Signed)
[facility] faxed refill request for the following medications: ° °triamterene-hydrochlorothiazide (MAXZIDE) 75-50 MG tablet ° ° ° °Please advise. °

## 2019-01-08 ENCOUNTER — Ambulatory Visit: Payer: BC Managed Care – PPO | Admitting: Family Medicine

## 2019-01-08 ENCOUNTER — Encounter: Payer: Self-pay | Admitting: Family Medicine

## 2019-01-08 ENCOUNTER — Other Ambulatory Visit: Payer: Self-pay

## 2019-01-08 VITALS — BP 132/83 | HR 86 | Temp 97.1°F | Wt 249.8 lb

## 2019-01-08 DIAGNOSIS — I10 Essential (primary) hypertension: Secondary | ICD-10-CM | POA: Diagnosis not present

## 2019-01-08 DIAGNOSIS — Z8349 Family history of other endocrine, nutritional and metabolic diseases: Secondary | ICD-10-CM | POA: Insufficient documentation

## 2019-01-08 DIAGNOSIS — K219 Gastro-esophageal reflux disease without esophagitis: Secondary | ICD-10-CM

## 2019-01-08 DIAGNOSIS — Z114 Encounter for screening for human immunodeficiency virus [HIV]: Secondary | ICD-10-CM | POA: Diagnosis not present

## 2019-01-08 DIAGNOSIS — Z1159 Encounter for screening for other viral diseases: Secondary | ICD-10-CM

## 2019-01-08 MED ORDER — LISINOPRIL 10 MG PO TABS: 10.0000 mg | ORAL_TABLET | Freq: Every day | ORAL | 3 refills | Status: DC

## 2019-01-08 MED ORDER — TRIAMTERENE-HCTZ 75-50 MG PO TABS: 1.0000 | ORAL_TABLET | Freq: Every day | ORAL | 3 refills | Status: DC

## 2019-01-08 NOTE — Assessment & Plan Note (Signed)
Discussed importance of healthy weight management Discussed diet and exercise  

## 2019-01-08 NOTE — Assessment & Plan Note (Signed)
Well controlled Continue current medications Recheck metabolic panel F/u in 6 months  

## 2019-01-08 NOTE — Patient Instructions (Signed)

## 2019-01-08 NOTE — Assessment & Plan Note (Signed)
Will check TSH annually

## 2019-01-08 NOTE — Progress Notes (Signed)
Patient: Brittney Sullivan Female    DOB: December 30, 1960   58 y.o.   MRN: [id number] Visit Date: 01/08/2019  Today's Provider: Shirlee Latch, MD   Chief Complaint  Patient presents with  . Hypertension   Subjective:    I, Presley Raddle, CMA, am acting as a Neurosurgeon for Shirlee Latch, MD.   HPI  Hypertension, follow-up:  BP Readings from Last 3 Encounters:  01/08/19 132/83  05/09/18 132/84  04/18/18 (!) 140/100    She was last seen for hypertension 7 months ago.  BP at that visit was 132/84. Management changes since that visit include no change. She reports good compliance with treatment. She is not having side effects.  She is exercising lightly. She is adherent to low salt diet.   Outside blood pressures are being checked at home. She is experiencing none.  Patient denies chest pain, chest pressure/discomfort, claudication, dyspnea, exertional chest pressure/discomfort, fatigue, irregular heart beat, lower extremity edema, near-syncope, orthopnea, palpitations, paroxysmal nocturnal dyspnea, syncope and tachypnea.   Cardiovascular risk factors include hypertension and obesity (BMI >= 30 kg/m2).  Use of agents associated with hypertension: none.     Weight trend: stable Wt Readings from Last 3 Encounters:  01/08/19 249 lb 12.8 oz (113.3 kg)  05/09/18 262 lb (118.8 kg)  04/18/18 261 lb 12.8 oz (118.8 kg)    ----------------------------------------------------------------------- Allergies  Allergen Reactions  . Alprazolam     Other reaction(s): Headache Difficulty breathing     Current Outpatient Medications:  .  aspirin-acetaminophen-caffeine (EXCEDRIN MIGRAINE) 250-250-65 MG tablet, Take by mouth., Disp: , Rfl:  .  esomeprazole (NEXIUM 24HR) 20 MG capsule, Take by mouth., Disp: , Rfl:  .  lisinopril (PRINIVIL,ZESTRIL) 10 MG tablet, Take 1 tablet (10 mg total) by mouth daily., Disp: 90 tablet, Rfl: 3 .  triamterene-hydrochlorothiazide (MAXZIDE) 75-50 MG  tablet, Take 1 tablet by mouth daily., Disp: 30 tablet, Rfl: 0 .  clonazePAM (KLONOPIN) 0.5 MG tablet, Take one or two twice daily as needed for stress/insomnia (Patient not taking: Reported on 05/09/2018), Disp: 30 tablet, Rfl: 1 .  cyclobenzaprine (FLEXERIL) 5 MG tablet, Take 1 tablet (5 mg total) by mouth 3 (three) times daily as needed for muscle spasms. (Patient not taking: Reported on 01/08/2019), Disp: 21 tablet, Rfl: 0  Review of Systems  Constitutional: Negative.   Respiratory: Negative.   Cardiovascular: Negative.   Musculoskeletal: Negative.   Neurological: Negative.     Social History   Tobacco Use  . Smoking status: Never Smoker  . Smokeless tobacco: Never Used  Substance Use Topics  . Alcohol use: No    Alcohol/week: 0.0 standard drinks      Objective:   BP 132/83 (BP Location: Right Arm, Patient Position: Sitting, Cuff Size: Large)   Pulse 86   Temp (!) 97.1 F (36.2 C) (Oral)   Wt 249 lb 12.8 oz (113.3 kg)   SpO2 98%   BMI 48.79 kg/m  Vitals:   01/08/19 1553  BP: 132/83  Pulse: 86  Temp: (!) 97.1 F (36.2 C)  TempSrc: Oral  SpO2: 98%  Weight: 249 lb 12.8 oz (113.3 kg)     Physical Exam Vitals signs reviewed.  Constitutional:      General: She is not in acute distress.    Appearance: Normal appearance. She is well-developed. She is not diaphoretic.  HENT:     Head: Normocephalic and atraumatic.  Eyes:     General: No scleral icterus.    Conjunctiva/sclera:  Conjunctivae normal.  Neck:     Musculoskeletal: Neck supple.     Thyroid: No thyromegaly.  Cardiovascular:     Rate and Rhythm: Normal rate and regular rhythm.     Pulses: Normal pulses.     Heart sounds: Normal heart sounds. No murmur.  Pulmonary:     Effort: Pulmonary effort is normal. No respiratory distress.     Breath sounds: Normal breath sounds. No wheezing, rhonchi or rales.  Abdominal:     General: There is no distension.     Palpations: Abdomen is soft.     Tenderness:  There is no abdominal tenderness.  Musculoskeletal:     Right lower leg: No edema.     Left lower leg: No edema.  Lymphadenopathy:     Cervical: No cervical adenopathy.  Skin:    General: Skin is warm and dry.     Capillary Refill: Capillary refill takes less than 2 seconds.     Findings: No rash.  Neurological:     Mental Status: She is alert and oriented to person, place, and time. Mental status is at baseline.  Psychiatric:        Mood and Affect: Mood normal.        Behavior: Behavior normal.      No results found for any visits on 01/08/19.     Assessment & Plan   Problem List Items Addressed This Visit      Cardiovascular and Mediastinum   BP (high blood pressure) - Primary    Well controlled Continue current medications Recheck metabolic panel F/u in 6 months       Relevant Medications   triamterene-hydrochlorothiazide (MAXZIDE) 75-50 MG tablet   lisinopril (ZESTRIL) 10 MG tablet   Other Relevant Orders   Lipid panel   Comprehensive metabolic panel     Digestive   Esophageal reflux    Well controlled  No longer on PPI Check CBC      Relevant Orders   CBC w/Diff/Platelet   Comprehensive metabolic panel     Other   Morbid obesity (HCC)    Discussed importance of healthy weight management Discussed diet and exercise       Relevant Orders   Lipid panel   TSH   CBC w/Diff/Platelet   Comprehensive metabolic panel   Family history of thyroid disease    Will check TSH annually      Relevant Orders   TSH    Other Visit Diagnoses    Need for hepatitis C screening test       Relevant Orders   Hepatitis C Antibody   Screening for HIV (human immunodeficiency virus)       Relevant Orders   HIV antibody (with reflex)       Return in about 3 months (around 04/10/2019) for CPE.   The entirety of the information documented in the History of Present Illness, Review of Systems and Physical Exam were personally obtained by me. Portions of this  information were initially documented by Presley Raddle, CMA and reviewed by me for thoroughness and accuracy.    Layia Walla, Marzella Schlein, MD MPH [facility]

## 2019-01-08 NOTE — Assessment & Plan Note (Signed)
Well controlled  No longer on PPI Check CBC

## 2019-01-16 ENCOUNTER — Telehealth: Payer: Self-pay

## 2019-01-16 LAB — LIPID PANEL
Chol/HDL Ratio: 3.3 ratio (ref 0.0–4.4)
Cholesterol, Total: 164 mg/dL (ref 100–199)
HDL: 50 mg/dL (ref >39)
LDL Chol Calc (NIH): 96 mg/dL (ref 0–99)
Triglycerides: 95 mg/dL (ref 0–149)
VLDL Cholesterol Cal: 18 mg/dL (ref 5–40)

## 2019-01-16 LAB — CBC WITH DIFFERENTIAL/PLATELET
Basophils Absolute: 0.1 10*3/uL (ref 0.0–0.2)
Basos: 1 %
EOS (ABSOLUTE): 0.1 10*3/uL (ref 0.0–0.4)
Eos: 2 %
Hematocrit: 38.5 % (ref 34.0–46.6)
Hemoglobin: 13.3 g/dL (ref 11.1–15.9)
Immature Grans (Abs): 0 10*3/uL (ref 0.0–0.1)
Immature Granulocytes: 0 %
Lymphocytes Absolute: 2.6 10*3/uL (ref 0.7–3.1)
Lymphs: 37 %
MCH: 30.7 pg (ref 26.6–33.0)
MCHC: 34.5 g/dL (ref 31.5–35.7)
MCV: 89 fL (ref 79–97)
Monocytes Absolute: 0.7 10*3/uL (ref 0.1–0.9)
Monocytes: 10 %
Neutrophils Absolute: 3.4 10*3/uL (ref 1.4–7.0)
Neutrophils: 50 %
Platelets: 333 10*3/uL (ref 150–450)
RBC: 4.33 x10E6/uL (ref 3.77–5.28)
RDW: 13 % (ref 11.7–15.4)
WBC: 6.9 10*3/uL (ref 3.4–10.8)

## 2019-01-16 LAB — COMPREHENSIVE METABOLIC PANEL
ALT: 17 IU/L (ref 0–32)
AST: 12 IU/L (ref 0–40)
Albumin/Globulin Ratio: 1.3 (ref 1.2–2.2)
Albumin: 4.4 g/dL (ref 3.8–4.9)
Alkaline Phosphatase: 81 IU/L (ref 39–117)
BUN/Creatinine Ratio: 18 (ref 9–23)
BUN: 33 mg/dL — ABNORMAL HIGH (ref 6–24)
Bilirubin Total: 0.4 mg/dL (ref 0.0–1.2)
CO2: 21 mmol/L (ref 20–29)
Calcium: 9.7 mg/dL (ref 8.7–10.2)
Chloride: 102 mmol/L (ref 96–106)
Creatinine, Ser: 1.86 mg/dL — ABNORMAL HIGH (ref 0.57–1.00)
GFR calc Af Amer: 34 mL/min/{1.73_m2} — ABNORMAL LOW (ref >59)
GFR calc non Af Amer: 29 mL/min/{1.73_m2} — ABNORMAL LOW (ref >59)
Globulin, Total: 3.3 g/dL (ref 1.5–4.5)
Glucose: 103 mg/dL — ABNORMAL HIGH (ref 65–99)
Potassium: 4.4 mmol/L (ref 3.5–5.2)
Sodium: 139 mmol/L (ref 134–144)
Total Protein: 7.7 g/dL (ref 6.0–8.5)

## 2019-01-16 LAB — HIV ANTIBODY (ROUTINE TESTING W REFLEX): HIV Screen 4th Generation wRfx: NONREACTIVE

## 2019-01-16 LAB — HEPATITIS C ANTIBODY: Hep C Virus Ab: <0.1 s/co ratio (ref 0.0–0.9)

## 2019-01-16 LAB — TSH: TSH: 2.12 u[IU]/mL (ref 0.450–4.500)

## 2019-01-16 NOTE — Telephone Encounter (Signed)
-----   Message from Virginia Crews, MD sent at 01/16/2019  3:11 PM EDT ----- Normal labs, except kidney function has decreased significantly since last checked.  Staying hydrated?  Would push fluids and avoid NSAIDs and recheck in 1 week.  If still down, will need to potentially change 1 or more of her BP meds.

## 2019-01-16 NOTE — Telephone Encounter (Signed)
Patient was advised and states that she will try to started drinking more water. Patient states she will returns next or the week after next.

## 2019-04-16 ENCOUNTER — Encounter: Payer: BC Managed Care – PPO | Admitting: Family Medicine

## 2020-01-24 ENCOUNTER — Other Ambulatory Visit: Payer: Self-pay | Admitting: Family Medicine

## 2020-01-24 DIAGNOSIS — I10 Essential (primary) hypertension: Secondary | ICD-10-CM

## 2020-01-24 NOTE — Telephone Encounter (Signed)
Requested medication (s) are due for refill today: yes   Requested medication (s) are on the active medication list: yes   Last refill: 10/29/2019  Future visit scheduled:no  Notes to clinic: overdue for follow up appointment    Requested Prescriptions  Pending Prescriptions Disp Refills   triamterene-hydrochlorothiazide (MAXZIDE) 75-50 MG tablet [Pharmacy Med Name: TRIAMTERENE-HCTZ 75-50 MG TAB] 90 tablet 3    Sig: TAKE 1 TABLET BY MOUTH EVERY DAY      Cardiovascular: Diuretic Combos Failed - 01/24/2020  9:05 AM      Failed - K in normal range and within 360 days    Potassium  Date Value Ref Range Status  01/15/2019 4.4 3.5 - 5.2 mmol/L Final          Failed - Na in normal range and within 360 days    Sodium  Date Value Ref Range Status  01/15/2019 139 134 - 144 mmol/L Final          Failed - Cr in normal range and within 360 days    Creatinine, Ser  Date Value Ref Range Status  01/15/2019 1.86 (H) 0.57 - 1.00 mg/dL Final          Failed - Ca in normal range and within 360 days    Calcium  Date Value Ref Range Status  01/15/2019 9.7 8.7 - 10.2 mg/dL Final          Failed - Valid encounter within last 6 months    Recent Outpatient Visits           1 year ago Essential hypertension   [facility] [place], Marzella Schlein, MD   1 year ago Essential hypertension   [facility] [place], [place]   1 year ago Essential hypertension   [facility] [place], [place]   1 year ago Essential hypertension   [facility] [place], [place], [place]   4 years ago Obesity   [facility] [place], [place]              Passed - Last BP in normal range    BP Readings from Last 1 Encounters:  01/08/19 132/83

## 2020-02-23 ENCOUNTER — Other Ambulatory Visit: Payer: Self-pay | Admitting: Family Medicine

## 2020-02-23 DIAGNOSIS — I10 Essential (primary) hypertension: Secondary | ICD-10-CM

## 2022-09-17 DIAGNOSIS — R7303 Prediabetes: Secondary | ICD-10-CM | POA: Insufficient documentation

## 2023-01-16 ENCOUNTER — Encounter: Payer: Self-pay | Admitting: Emergency Medicine

## 2023-01-16 ENCOUNTER — Emergency Department: Payer: BC Managed Care – PPO

## 2023-01-16 ENCOUNTER — Inpatient Hospital Stay
Admission: EM | Admit: 2023-01-16 | Discharge: 2023-01-21 | DRG: 682 | Disposition: A | Discharge status: Home / Self Care | Payer: BC Managed Care – PPO | Attending: Internal Medicine | Admitting: Internal Medicine

## 2023-01-16 ENCOUNTER — Other Ambulatory Visit: Payer: Self-pay

## 2023-01-16 DIAGNOSIS — Z8249 Family history of ischemic heart disease and other diseases of the circulatory system: Secondary | ICD-10-CM

## 2023-01-16 DIAGNOSIS — N179 Acute kidney failure, unspecified: Principal | ICD-10-CM | POA: Diagnosis present

## 2023-01-16 DIAGNOSIS — Z79899 Other long term (current) drug therapy: Secondary | ICD-10-CM | POA: Diagnosis not present

## 2023-01-16 DIAGNOSIS — Z888 Allergy status to other drugs, medicaments and biological substances status: Secondary | ICD-10-CM | POA: Diagnosis not present

## 2023-01-16 DIAGNOSIS — E871 Hypo-osmolality and hyponatremia: Secondary | ICD-10-CM | POA: Diagnosis present

## 2023-01-16 DIAGNOSIS — E860 Dehydration: Secondary | ICD-10-CM | POA: Diagnosis present

## 2023-01-16 DIAGNOSIS — G4700 Insomnia, unspecified: Secondary | ICD-10-CM | POA: Diagnosis present

## 2023-01-16 DIAGNOSIS — Z83438 Family history of other disorder of lipoprotein metabolism and other lipidemia: Secondary | ICD-10-CM

## 2023-01-16 DIAGNOSIS — Z6841 Body Mass Index (BMI) 40.0 and over, adult: Secondary | ICD-10-CM

## 2023-01-16 DIAGNOSIS — I10 Essential (primary) hypertension: Secondary | ICD-10-CM | POA: Diagnosis present

## 2023-01-16 DIAGNOSIS — R531 Weakness: Secondary | ICD-10-CM | POA: Diagnosis not present

## 2023-01-16 DIAGNOSIS — E559 Vitamin D deficiency, unspecified: Secondary | ICD-10-CM | POA: Diagnosis present

## 2023-01-16 DIAGNOSIS — U071 COVID-19: Secondary | ICD-10-CM | POA: Diagnosis present

## 2023-01-16 DIAGNOSIS — K219 Gastro-esophageal reflux disease without esophagitis: Secondary | ICD-10-CM | POA: Diagnosis present

## 2023-01-16 DIAGNOSIS — N189 Chronic kidney disease, unspecified: Secondary | ICD-10-CM | POA: Diagnosis present

## 2023-01-16 LAB — CBC
HCT: 43.7 % (ref 36.0–46.0)
Hemoglobin: 15.3 g/dL — ABNORMAL HIGH (ref 12.0–15.0)
MCH: 29.4 pg (ref 26.0–34.0)
MCHC: 35 g/dL (ref 30.0–36.0)
MCV: 83.9 fL (ref 80.0–100.0)
Platelets: 335 10*3/uL (ref 150–400)
RBC: 5.21 MIL/uL — ABNORMAL HIGH (ref 3.87–5.11)
RDW: 13.7 % (ref 11.5–15.5)
WBC: 8.8 10*3/uL (ref 4.0–10.5)
nRBC: 0 % (ref 0.0–0.2)

## 2023-01-16 LAB — COMPREHENSIVE METABOLIC PANEL
ALT: 16 U/L (ref 0–44)
AST: 14 U/L — ABNORMAL LOW (ref 15–41)
Albumin: 4.7 g/dL (ref 3.5–5.0)
Alkaline Phosphatase: 66 U/L (ref 38–126)
Anion gap: 16 — ABNORMAL HIGH (ref 5–15)
BUN: 94 mg/dL — ABNORMAL HIGH (ref 8–23)
CO2: 18 mmol/L — ABNORMAL LOW (ref 22–32)
Calcium: 9.3 mg/dL (ref 8.9–10.3)
Chloride: 99 mmol/L (ref 98–111)
Creatinine, Ser: 5.01 mg/dL — ABNORMAL HIGH (ref 0.44–1.00)
GFR, Estimated: 9 mL/min — ABNORMAL LOW (ref >60)
Glucose, Bld: 114 mg/dL — ABNORMAL HIGH (ref 70–99)
Potassium: 3.7 mmol/L (ref 3.5–5.1)
Sodium: 133 mmol/L — ABNORMAL LOW (ref 135–145)
Total Bilirubin: 0.7 mg/dL (ref 0.3–1.2)
Total Protein: 8.6 g/dL — ABNORMAL HIGH (ref 6.5–8.1)

## 2023-01-16 LAB — LIPASE, BLOOD: Lipase: 170 U/L — ABNORMAL HIGH (ref 11–51)

## 2023-01-16 LAB — SARS CORONAVIRUS 2 BY RT PCR: SARS Coronavirus 2 by RT PCR: POSITIVE — AB

## 2023-01-16 LAB — TROPONIN I (HIGH SENSITIVITY): Troponin I (High Sensitivity): 6 ng/L (ref <18)

## 2023-01-16 MED ORDER — SODIUM CHLORIDE 0.9 % IV SOLN: INTRAVENOUS | Status: AC

## 2023-01-16 MED ORDER — NEPRO/CARBSTEADY PO LIQD: 237.0000 mL | Freq: Three times a day (TID) | ORAL | Status: DC | PRN

## 2023-01-16 MED ORDER — HYDROXYZINE HCL 25 MG PO TABS: 25.0000 mg | ORAL_TABLET | Freq: Three times a day (TID) | ORAL | Status: DC | PRN

## 2023-01-16 MED ORDER — ZOLPIDEM TARTRATE 5 MG PO TABS
5.0000 mg | ORAL_TABLET | Freq: Every evening | ORAL | Status: DC | PRN
  Administered 2023-01-18 – 2023-01-20 (×3): 5 mg via ORAL
  Filled 2023-01-16 (×3): qty 1

## 2023-01-16 MED ORDER — ACETAMINOPHEN 325 MG PO TABS
650.0000 mg | ORAL_TABLET | Freq: Four times a day (QID) | ORAL | Status: DC | PRN
  Administered 2023-01-20 (×3): 650 mg via ORAL
  Filled 2023-01-16 (×3): qty 2

## 2023-01-16 MED ORDER — ONDANSETRON HCL 4 MG/2ML IJ SOLN
4.0000 mg | Freq: Four times a day (QID) | INTRAMUSCULAR | Status: DC | PRN
  Administered 2023-01-16 – 2023-01-17 (×2): 4 mg via INTRAVENOUS
  Filled 2023-01-16 (×2): qty 2

## 2023-01-16 MED ORDER — SODIUM CHLORIDE 0.9 % IV BOLUS
1000.0000 mL | Freq: Once | INTRAVENOUS | Status: AC
  Administered 2023-01-16: 1000 mL via INTRAVENOUS

## 2023-01-16 MED ORDER — ACETAMINOPHEN 650 MG RE SUPP: 650.0000 mg | Freq: Four times a day (QID) | RECTAL | Status: DC | PRN

## 2023-01-16 MED ORDER — SORBITOL 70 % SOLN: 30.0000 mL | Status: DC | PRN

## 2023-01-16 MED ORDER — HEPARIN SODIUM (PORCINE) 5000 UNIT/ML IJ SOLN
5000.0000 [IU] | Freq: Three times a day (TID) | INTRAMUSCULAR | Status: DC
  Administered 2023-01-18 – 2023-01-21 (×9): 5000 [IU] via SUBCUTANEOUS
  Filled 2023-01-16 (×13): qty 1

## 2023-01-16 MED ORDER — ONDANSETRON HCL 4 MG PO TABS
4.0000 mg | ORAL_TABLET | Freq: Four times a day (QID) | ORAL | Status: DC | PRN
  Administered 2023-01-20 (×2): 4 mg via ORAL
  Filled 2023-01-16 (×2): qty 1

## 2023-01-16 MED ORDER — ONDANSETRON HCL 4 MG/2ML IJ SOLN
4.0000 mg | Freq: Once | INTRAMUSCULAR | Status: AC
  Administered 2023-01-16: 4 mg via INTRAVENOUS
  Filled 2023-01-16: qty 2

## 2023-01-16 MED ORDER — DOCUSATE SODIUM 283 MG RE ENEM
1.0000 | ENEMA | RECTAL | Status: DC | PRN
  Filled 2023-01-16: qty 1

## 2023-01-16 MED ORDER — CAMPHOR-MENTHOL 0.5-0.5 % EX LOTN: 1.0000 | TOPICAL_LOTION | Freq: Three times a day (TID) | CUTANEOUS | Status: DC | PRN

## 2023-01-16 MED ORDER — CALCIUM CARBONATE ANTACID 1250 MG/5ML PO SUSP
500.0000 mg | Freq: Four times a day (QID) | ORAL | Status: DC | PRN
  Administered 2023-01-17: 500 mg via ORAL
  Filled 2023-01-16 (×2): qty 5

## 2023-01-16 NOTE — ED Notes (Signed)
.ED TO INPATIENT HANDOFF REPORT  ED Nurse Name and Phone #:  Erin Hearing [phone]  S Name/Age/Gender Brittney Sullivan 62 y.o. female Room/Bed: ED01HA/ED01HA  Code Status   Code Status: Not on file  Home/SNF/Other Home Patient oriented to: self, place, time, and situation Is this baseline? Yes   Triage Complete: Triage complete  Chief Complaint AKI (acute kidney injury) (HCC) [N17.9]  Triage Note First Nurse Note;  Pt via POV from [facility]. Pt c/o NVD for the past week. Pt also reports dizziness and headache. Pt is A&Ox4 and NAD KC report 75/86 BP  96 HR   Patient to ED from [facility] for N/V/D x9 days. Also having dizziness and weakness. Reports low BP at [facility]. AOx4   Allergies Allergies  Allergen Reactions   Alprazolam     Other reaction(s): Headache Difficulty breathing    Level of Care/Admitting Diagnosis ED Disposition     ED Disposition  Admit   Condition  --   Comment  Hospital Area: [facility] [[id number]]  Level of Care: Telemetry Medical [[id number]]  Covid Evaluation: Confirmed COVID Positive  Diagnosis: AKI (acute kidney injury) (HCC) [956213]  Admitting Physician: Rometta Emery [[id number]]  Attending Physician: Rometta Emery [[id number]]  Certification:: I certify this patient will need inpatient services for at least 2 midnights  Expected Medical Readiness: 01/19/2023          B Medical/Surgery History Past Medical History:  Diagnosis Date   Allergic rhinitis    Arthritis    GERD (gastroesophageal reflux disease)    Hypertension    Insomnia    Irregular heart rhythm    Migraine    RAD (reactive airway disease)    Vitamin D deficiency    Past Surgical History:  Procedure Laterality Date   BRAIN SURGERY  1962   blood vessel on the outside of the brain     A IV Location/Drains/Wounds Patient Lines/Drains/Airways Status     Active Line/Drains/Airways     Name Placement date Placement time Site Days   Peripheral IV 01/16/23 20 G  1.25" Anterior;Proximal;Right Forearm 01/16/23  1950  Forearm  less than 1            Intake/Output Last 24 hours No intake or output data in the 24 hours ending 01/16/23 2049  Labs/Imaging Results for orders placed or performed during the hospital encounter of 01/16/23 (from the past 48 hour(s))  Lipase, blood     Status: Abnormal   Collection Time: 01/16/23  5:50 PM  Result Value Ref Range   Lipase 170 (H) 11 - 51 U/L    Comment: Performed at [facility], [address] [place], [place] [address]  Comprehensive metabolic panel     Status: Abnormal   Collection Time: 01/16/23  5:50 PM  Result Value Ref Range   Sodium 133 (L) 135 - 145 mmol/L   Potassium 3.7 3.5 - 5.1 mmol/L   Chloride 99 98 - 111 mmol/L   CO2 18 (L) 22 - 32 mmol/L   Glucose, Bld 114 (H) 70 - 99 mg/dL    Comment: Glucose reference range applies only to samples taken after fasting for at least 8 hours.   BUN 94 (H) 8 - 23 mg/dL    Comment: RESULT CONFIRMED BY MANUAL DILUTION SKL   Creatinine, Ser 5.01 (H) 0.44 - 1.00 mg/dL   Calcium 9.3 8.9 - 84.6 mg/dL   Total Protein 8.6 (H) 6.5 - 8.1 g/dL   Albumin 4.7  3.5 - 5.0 g/dL   AST 14 (L) 15 - 41 U/L   ALT 16 0 - 44 U/L   Alkaline Phosphatase 66 38 - 126 U/L   Total Bilirubin 0.7 0.3 - 1.2 mg/dL   GFR, Estimated 9 (L) >60 mL/min    Comment: (NOTE) Calculated using the CKD-EPI Creatinine Equation (2021)    Anion gap 16 (H) 5 - 15    Comment: Performed at [facility], [address] [place], [place] [address]  CBC     Status: Abnormal   Collection Time: 01/16/23  5:50 PM  Result Value Ref Range   WBC 8.8 4.0 - 10.5 K/uL   RBC 5.21 (H) 3.87 - 5.11 MIL/uL   Hemoglobin 15.3 (H) 12.0 - 15.0 g/dL   HCT 95.2 84.1 - 32.4 %   MCV 83.9 80.0 - 100.0 fL   MCH 29.4 26.0 - 34.0 pg   MCHC 35.0 30.0 - 36.0 g/dL   RDW 40.1 02.7 - 25.3 %   Platelets 335 150 - 400 K/uL   nRBC 0.0 0.0 - 0.2 %    Comment: Performed at [facility], [address], [place], [place] [address]  Troponin I (High Sensitivity)     Status: None   Collection Time: 01/16/23  5:50 PM  Result Value Ref Range   Troponin I (High Sensitivity) 6 <18 ng/L    Comment: (NOTE) Elevated high sensitivity troponin I (hsTnI) values and significant  changes across serial measurements may suggest ACS but many other  chronic and acute conditions are known to elevate hsTnI results.  Refer to the "Links" section for chest pain algorithms and additional  guidance. Performed at [facility], [address] [place], [place] [address]   SARS Coronavirus 2 by RT PCR (hospital order, performed in [facility] hospital lab) *cepheid single result test* Anterior Nasal Swab     Status: Abnormal   Collection Time: 01/16/23  7:05 PM   Specimen: Anterior Nasal Swab  Result Value Ref Range   SARS Coronavirus 2 by RT PCR POSITIVE (A) NEGATIVE    Comment: (NOTE) SARS-CoV-2 target nucleic acids are DETECTED  SARS-CoV-2 RNA is generally detectable in upper respiratory specimens  during the acute phase of infection.  Positive results are indicative  of the presence of the identified virus, but do not rule out bacterial infection or co-infection with other pathogens not detected by the test.  Clinical correlation with patient history and  other diagnostic information is necessary to determine patient infection status.  The expected result is negative.  Fact Sheet for Patients:   RoadLapTop.co.za   Fact Sheet for Healthcare Providers:   http://kim-miller.com/    This test is not yet approved or cleared by the [place] FDA and  has been authorized for detection and/or diagnosis of SARS-CoV-2 by FDA under an Emergency Use Authorization (EUA).  This EUA will remain in effect (meaning this test can be used) for the duration of  the COVID-19 declaration under Section 564(b)(1)  of the Act, 21 U.S.C. section  360-bbb-3(b)(1), unless the authorization is terminated or revoked sooner.   Performed at [facility], [address] [place], [place] [address]    CT ABDOMEN PELVIS WO CONTRAST  Result Date: 01/16/2023 CLINICAL DATA:  Acute nonlocalized abdominal pain. Nausea, vomiting, and diarrhea for 9 days. Dizziness and weakness. EXAM: CT ABDOMEN AND PELVIS WITHOUT CONTRAST TECHNIQUE: Multidetector CT imaging of the abdomen and pelvis was performed following the standard protocol  without IV contrast. RADIATION DOSE REDUCTION: This exam was performed according to the departmental dose-optimization program which includes automated exposure control, adjustment of the mA and/or kV according to patient size and/or use of iterative reconstruction technique. COMPARISON:  05/23/2005 FINDINGS: Lower chest: No acute abnormality. Hepatobiliary: No focal liver lesions. Gallbladder is distended. No wall thickening or stones identified. No bile duct dilatation. Pancreas: Unremarkable. No pancreatic ductal dilatation or surrounding inflammatory changes. Spleen: Normal in size without focal abnormality. Adrenals/Urinary Tract: Adrenal glands are unremarkable. Kidneys are normal, without renal calculi, focal lesion, or hydronephrosis. Bladder is unremarkable. Stomach/Bowel: Stomach is within normal limits. Appendix appears normal. No evidence of bowel wall thickening, distention, or inflammatory changes. Vascular/Lymphatic: No significant vascular findings are present. No enlarged abdominal or pelvic lymph nodes. Reproductive: Uterus and bilateral adnexa are unremarkable. Other: No abdominal wall hernia or abnormality. No abdominopelvic ascites. Musculoskeletal: No acute or significant osseous findings. IMPRESSION: Distended gallbladder without stone or wall thickening. This is nonspecific but could indicate decreased gallbladder function. Consider radionuclide biliary scan for evaluation of gallbladder function. No bowel  obstruction or inflammation. Electronically Signed   By: Burman Nieves M.D.   On: 01/16/2023 19:56    Pending Labs Unresulted Labs (From admission, onward)     Start     Ordered   01/16/23 1652  Urinalysis, Routine w reflex microscopic -Urine, Clean Catch  Once,   URGENT       Question:  Specimen Source  Answer:  Urine, Clean Catch   01/16/23 1651            Vitals/Pain Today's Vitals   01/16/23 1650 01/16/23 1848  BP: (!) 100/46 109/80  Pulse: 90 83  Resp: 18 16  Temp: 97.7 F (36.5 C) 97.7 F (36.5 C)  TempSrc: Oral Oral  SpO2: 99% 100%  Weight: 99.8 kg   Height: 5' (1.524 m)   PainSc: 0-No pain 0-No pain    Isolation Precautions No active isolations  Medications Medications  sodium chloride 0.9 % bolus 1,000 mL (1,000 mLs Intravenous New Bag/Given 01/16/23 2044)  sodium chloride 0.9 % bolus 1,000 mL (1,000 mLs Intravenous New Bag/Given 01/16/23 1908)  ondansetron (ZOFRAN) injection 4 mg (4 mg Intravenous Given 01/16/23 1909)    Mobility walks     Focused Assessments      R Recommendations: See Admitting Provider Note  Report given to:   Additional Notes:

## 2023-01-16 NOTE — H&P (Signed)
History and Physical    Patient: Brittney Sullivan WUJ:[id number] DOB: [birth year] DOA: 01/16/2023 DOS: the patient was seen and examined on 01/16/2023 PCP: Pcp, No  Patient coming from: Home  Chief Complaint:  Chief Complaint  Patient presents with   Emesis   HPI: Brittney Sullivan is a 62 y.o. female with medical history significant of GERD, essential hypertension, allergic rhinitis, previous brain surgery, migraine headaches, who presented to the ER with nausea vomiting and diarrhea for the past week.  Also with dizziness and headache.  On arrival systolic blood pressure was reported to have been as low as 75.  Patient also reported some weakness and cough.  Congestion fever also was present last week.  No shortness of breath.  Patient was evaluated and found to be COVID-19 positive. She was also noted to have a creatinine of 5 with a BUN of 114.  She had a lipase of 170.  Patient therefore appears to have prerenal AKI most likely as a result of her GI symptoms from COVID-19 infection.  Patient therefore being admitted for further evaluation and treatment.  Review of Systems: As mentioned in the history of present illness. All other systems reviewed and are negative. Past Medical History:  Diagnosis Date   Allergic rhinitis    Arthritis    GERD (gastroesophageal reflux disease)    Hypertension    Insomnia    Irregular heart rhythm    Migraine    RAD (reactive airway disease)    Vitamin D deficiency    Past Surgical History:  Procedure Laterality Date   BRAIN SURGERY  1962   blood vessel on the outside of the brain   Social History:  reports that she has never smoked. She has never used smokeless tobacco. She reports that she does not drink alcohol and does not use drugs.  Allergies  Allergen Reactions   Alprazolam     Other reaction(s): Headache Difficulty breathing    Family History  Problem Relation Age of Onset   Hypercholesterolemia Mother    Gout Mother    Cancer Father         lung   Hypertension Father    Gout Brother    Hypertension Son    Healthy Son    Ovarian cysts Sister     Prior to Admission medications   Medication Sig Start Date End Date Taking? Authorizing Provider  aspirin-acetaminophen-caffeine (EXCEDRIN MIGRAINE) [id number] MG tablet Take by mouth. 02/24/10   [provider]  lisinopril (ZESTRIL) 10 MG tablet Take 1 tablet (10 mg total) by mouth daily. 01/08/19   Erasmo Downer, MD  triamterene-hydrochlorothiazide (MAXZIDE) 75-50 MG tablet Take 1 tablet by mouth daily. Please schedule an office visit before anymore refills. 01/25/20   Erasmo Downer, MD    Physical Exam: Vitals:   01/16/23 1650 01/16/23 1848 01/16/23 2055 01/16/23 2134  BP: (!) 100/46 109/80 117/85 110/63  Pulse: 90 83 80 88  Resp: 18 16 17 20   Temp: 97.7 F (36.5 C) 97.7 F (36.5 C) 98.4 F (36.9 C) 97.7 F (36.5 C)  TempSrc: Oral Oral Oral Oral  SpO2: 99% 100% 100% 99%  Weight: 99.8 kg   104.8 kg  Height: 5' (1.524 m)   5' (1.524 m)   Constitutional: Morbidly obese, no distress NAD, calm, comfortable Eyes: PERRL, lids and conjunctivae normal ENMT: Mucous membranes are dry. Posterior pharynx clear of any exudate or lesions.Normal dentition.  Neck: normal, supple, no masses, no thyromegaly Respiratory: clear to  auscultation bilaterally, no wheezing, no crackles. Normal respiratory effort. No accessory muscle use.  Cardiovascular: Regular rate and rhythm, no murmurs / rubs / gallops. No extremity edema. 2+ pedal pulses. No carotid bruits.  Abdomen: no tenderness, no masses palpated. No hepatosplenomegaly. Bowel sounds positive.  Musculoskeletal: Good range of motion, no joint swelling or tenderness, Skin: no rashes, lesions, ulcers. No induration Neurologic: CN 2-12 grossly intact. Sensation intact, DTR normal. Strength 5/5 in all 4.  Psychiatric: Normal judgment and insight. Alert and oriented x 3. Normal mood  Data Reviewed:  Sodium 133, CO2  18, glucose 114 BUN 94 creatinine 5.01, gap of 16, lipase 170 AST 14 total protein 8.6.  COVID-19 screen is positive.  Hemoglobin is 15.3.  CT abdomen pelvis shows distended gallbladder without stone or wall thickening.  No bowel obstruction.  Assessment and Plan:  #1 AKI: Most likely prerenal secondary to dehydration.  Patient has been having GI symptoms of nausea vomiting diarrhea.  Patient is also on lisinopril and diuretic for hypertension.  This will be stopped for now.  This appears to be secondary to her COVID-19 infection.  Will aggressively hydrate.  Follow renal function.  Encourage oral intake.  Nephrology consult if no significant change with hydration overnight.  #2 COVID-19 infection: Patient has no respiratory symptoms.  Her symptoms seems to be GI related.  The symptoms are also improving.  At this point we will consider dexamethasone.  Not a candidate for either COVID 19 specific medications.  She could be a candidate for Paxlovid outpatient.  #3 morbid obesity: Dietary counseling  #4 hyponatremia: Possibly due to dehydration.  Continue to monitor  #5 elevated lipase: No evidence of pancreatitis.  CT abdomen pelvis was normal.  #6 GERD: Continue with PPIs  #7 essential hypertension: Patient's home medications appear to be lisinopril as well as a diuretic.  We will hold those.  If blood pressure starts rising we will use hydralazine.    Advance Care Planning:   Code Status: Full Code   Consults: None  Family Communication: No family at bedside  Severity of Illness: The appropriate patient status for this patient is INPATIENT. Inpatient status is judged to be reasonable and necessary in order to provide the required intensity of service to ensure the patient's safety. The patient's presenting symptoms, physical exam findings, and initial radiographic and laboratory data in the context of their chronic comorbidities is felt to place them at high risk for further clinical  deterioration. Furthermore, it is not anticipated that the patient will be medically stable for discharge from the hospital within 2 midnights of admission.   * I certify that at the point of admission it is my clinical judgment that the patient will require inpatient hospital care spanning beyond 2 midnights from the point of admission due to high intensity of service, high risk for further deterioration and high frequency of surveillance required.*  AuthorLonia Blood, MD 01/16/2023 9:53 PM  For on call review [email or web address].

## 2023-01-16 NOTE — ED Triage Notes (Signed)
First Nurse Note;  Pt via POV from [facility]. Pt c/o NVD for the past week. Pt also reports dizziness and headache. Pt is A&Ox4 and NAD KC report 75/86 BP  96 HR

## 2023-01-16 NOTE — ED Provider Notes (Signed)
[facility] Provider Note    Event Date/Time   First MD Initiated Contact with Patient 01/16/23 1836     (approximate)  History   Chief Complaint: Emesis  HPI  Brittney Sullivan is a 62 y.o. female with a past medical history of hypertension, gastric reflux who presents to the emergency department for nausea vomiting diarrhea and weakness.  According to the patient for the past 9 days she has been feeling very weak with nausea vomiting diarrhea.  She states initially she was having cough congestion and fever that has since resolved but she continues to have no appetite frequent nausea occasional vomiting and continued diarrhea at times.  Patient denies any chest pain or shortness of breath.  Does state an indigestion sensation to the upper abdomen.  Denies any urinary symptoms. Physical Exam   Triage Vital Signs: ED Triage Vitals [01/16/23 1650]  Encounter Vitals Group     BP (!) 100/46     Systolic BP Percentile      Diastolic BP Percentile      Pulse Rate 90     Resp 18     Temp 97.7 F (36.5 C)     Temp Source Oral     SpO2 99 %     Weight 220 lb (99.8 kg)     Height 5' (1.524 m)     Head Circumference      Peak Flow      Pain Score 0     Pain Loc      Pain Education      Exclude from Growth Chart     Most recent vital signs: Vitals:   01/16/23 1650 01/16/23 1848  BP: (!) 100/46 109/80  Pulse: 90 83  Resp: 18 16  Temp: 97.7 F (36.5 C) 97.7 F (36.5 C)  SpO2: 99% 100%    General: Awake, no distress.  CV:  Good peripheral perfusion.  Regular rate and rhythm  Resp:  Normal effort.  Equal breath sounds bilaterally.  Abd:  No distention.  Soft, nontender.  No rebound or guarding.  ED Results / Procedures / Treatments   EKG  EKG viewed and interpreted by myself shows a normal sinus rhythm at 95 bpm with a narrow QRS, normal axis, normal intervals, no concerning ST changes.  RADIOLOGY  I reviewed and interpreted CT images.  Patient  does appear to have a distended gallbladder.  No other significant finding on my evaluation no signs of bowel obstruction. Radiology is read the CT scan is a distended gallbladder without stones or wall thickening.   MEDICATIONS ORDERED IN ED: Medications  sodium chloride 0.9 % bolus 1,000 mL (has no administration in time range)  sodium chloride 0.9 % bolus 1,000 mL (has no administration in time range)  ondansetron (ZOFRAN) injection 4 mg (has no administration in time range)     IMPRESSION / MDM / ASSESSMENT AND PLAN / ED COURSE  I reviewed the triage vital signs and the nursing notes.  Patient's presentation is most consistent with acute presentation with potential threat to life or bodily function.  Patient presents to the emergency department for mild upper abdominal discomfort/indigestion nausea vomiting diarrhea x 9 days.  Patient states initially she had a subjective fever as well as cough and congestion.  We will obtain a COVID swab as a precaution.  We will IV hydrate and treat nausea.  Benign abdomen on my examination.  Patient's lab work does show a reassuring CBC, normal  white blood cell count, patient's chemistry however shows renal failure creatinine of 5.0 with a baseline around 1.7.  Anion gap elevated to 16 with a bicarb of 18 likely indicating significant dehydration.  Patient's lipase is elevated around 170, reassuring LFTs.  Given the nausea vomiting diarrhea and elevated lipase we will obtain a CT scan without contrast given the renal dysfunction to further evaluate.  We will IV hydrate treat nausea obtain a urine sample continue to closely monitor.  Patient will require admission to the hospitalist once her workup is completed given her renal failure.  CT scan shows distended gallbladder without stones or thickening.  Patient states he has not been able to eat or drink anything in quite some time highly suspect this is a result of that.  Has no right upper quadrant  tenderness on my examination reassuring LFTs and a normal white blood cell count.  Patient's troponin is negative.  COVID test is positive likely explaining the patient's symptoms.  Patient's creatinine is 5.01.  We will admit to the hospital service.  FINAL CLINICAL IMPRESSION(S) / ED DIAGNOSES   Dehydration Acute renal failure COVID-19  Note:  This document was prepared using Dragon voice recognition software and may include unintentional dictation errors.   Minna Antis, MD 01/16/23 2032

## 2023-01-16 NOTE — ED Notes (Signed)
Attempted for lab work- unable to get blood. Lab called to come collect.

## 2023-01-16 NOTE — ED Triage Notes (Signed)
Patient to ED from [facility] for N/V/D x9 days. Also having dizziness and weakness. Reports low BP at [facility]. AOx4

## 2023-01-17 DIAGNOSIS — N179 Acute kidney failure, unspecified: Secondary | ICD-10-CM | POA: Diagnosis not present

## 2023-01-17 DIAGNOSIS — E871 Hypo-osmolality and hyponatremia: Secondary | ICD-10-CM | POA: Diagnosis not present

## 2023-01-17 DIAGNOSIS — U071 COVID-19: Secondary | ICD-10-CM

## 2023-01-17 DIAGNOSIS — K219 Gastro-esophageal reflux disease without esophagitis: Secondary | ICD-10-CM

## 2023-01-17 LAB — COMPREHENSIVE METABOLIC PANEL
ALT: 13 U/L (ref 0–44)
AST: 14 U/L — ABNORMAL LOW (ref 15–41)
Albumin: 3.5 g/dL (ref 3.5–5.0)
Alkaline Phosphatase: 49 U/L (ref 38–126)
Anion gap: 11 (ref 5–15)
BUN: 94 mg/dL — ABNORMAL HIGH (ref 8–23)
CO2: 18 mmol/L — ABNORMAL LOW (ref 22–32)
Calcium: 8.2 mg/dL — ABNORMAL LOW (ref 8.9–10.3)
Chloride: 105 mmol/L (ref 98–111)
Creatinine, Ser: 4.12 mg/dL — ABNORMAL HIGH (ref 0.44–1.00)
GFR, Estimated: 12 mL/min — ABNORMAL LOW (ref >60)
Glucose, Bld: 102 mg/dL — ABNORMAL HIGH (ref 70–99)
Potassium: 3.5 mmol/L (ref 3.5–5.1)
Sodium: 134 mmol/L — ABNORMAL LOW (ref 135–145)
Total Bilirubin: 0.9 mg/dL (ref 0.3–1.2)
Total Protein: 7 g/dL (ref 6.5–8.1)

## 2023-01-17 LAB — CBC
HCT: 37.7 % (ref 36.0–46.0)
Hemoglobin: 13 g/dL (ref 12.0–15.0)
MCH: 29.7 pg (ref 26.0–34.0)
MCHC: 34.5 g/dL (ref 30.0–36.0)
MCV: 86.3 fL (ref 80.0–100.0)
Platelets: 257 10*3/uL (ref 150–400)
RBC: 4.37 MIL/uL (ref 3.87–5.11)
RDW: 13.8 % (ref 11.5–15.5)
WBC: 7.9 10*3/uL (ref 4.0–10.5)
nRBC: 0 % (ref 0.0–0.2)

## 2023-01-17 LAB — HIV ANTIBODY (ROUTINE TESTING W REFLEX): HIV Screen 4th Generation wRfx: NONREACTIVE

## 2023-01-17 MED ORDER — ORAL CARE MOUTH RINSE: 15.0000 mL | OROMUCOSAL | Status: DC | PRN

## 2023-01-17 MED ORDER — ADULT MULTIVITAMIN W/MINERALS CH
1.0000 | ORAL_TABLET | Freq: Every day | ORAL | Status: DC
  Administered 2023-01-17 – 2023-01-21 (×5): 1 via ORAL
  Filled 2023-01-17 (×5): qty 1

## 2023-01-17 MED ORDER — ENSURE ENLIVE PO LIQD
237.0000 mL | Freq: Three times a day (TID) | ORAL | Status: DC
  Administered 2023-01-17 – 2023-01-21 (×5): 237 mL via ORAL

## 2023-01-17 NOTE — Progress Notes (Signed)
Initial Nutrition Assessment  DOCUMENTATION CODES:   Morbid obesity  INTERVENTION:   -Liberalize diet to regular for widest variety of meal selections -MVI with minerals daily -Ensure Enlive po TID, each supplement provides 350 kcal and 20 grams of protein -Magic cup TID with meals, each supplement provides 290 kcal and 9 grams of protein   NUTRITION DIAGNOSIS:   Increased nutrient needs related to acute illness as evidenced by estimated needs.  GOAL:   Patient will meet greater than or equal to 90% of their needs  MONITOR:   PO intake, Supplement acceptance  REASON FOR ASSESSMENT:   Consult Diet education  ASSESSMENT:   Pt with medical history significant of GERD, essential hypertension, allergic rhinitis, previous brain surgery, migraine headaches, who presented with nausea vomiting and diarrhea for the past week.  Pt admitted with AKI and COVID-19 infection.   Reviewed I/O's: +551 ml x 24 hours  UOP: 100 ml x 24 hours  Spoke with pt and sister at bedside. Pt reports decreased oral intake over the past two weeks. Pt shares she has been consuming mainly bites and sips due to inability to keep most foods down. In the past two weeks she reports she has only been able to drink cold liquids, especially Mountain Dew slushies. Prior to acute illness, pt reports that she was actively trying to lose weight by watching portions sizes and eating a lot of chicken and green salads.   Pt explains that she has had COVID three times and each time symptoms are worse., Pt acknowledges taste changes ("everything tastes like metal") and nausea related to the smell and appearance of food. Pt shares that she has been unable to find anything appealing on the renal diet. She feels most comfortable drinking at this time.   Noted pt with thin hair. Pt denies any hair loss. 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   Pt estimates she has lost weight, but unsure how much. She has felt lightheaded and weak. Reviewed wt hx. Per [facility], wt was 213# on 05/02/22. No wt loss noted over the past 9 months.   Discussed importance of good meal and supplement intake to promote healing. Pt amenable to supplements.   Medications reviewed and include 0.9% sodium chloride infusion @ 125 ml/hr.   Obesity is a complex, chronic medical condition that is optimally managed by a multidisciplinary care team. Weight loss is not an ideal goal for an acute inpatient hospitalization. However, if further work-up for obesity is warranted, consider outpatient referral to [facility].    No results found for: "HGBA1C" PTA DM medications are none.   Labs reviewed: Na: 134 (inpatient orders for glycemic control are none).    NUTRITION - FOCUSED PHYSICAL EXAM:  Flowsheet Row Most Recent Value  Orbital Region No depletion  Upper Arm Region No depletion  Thoracic and Lumbar Region No depletion  Buccal Region No depletion  Temple Region No depletion  Clavicle Bone Region No depletion  Clavicle and Acromion Bone Region No depletion  Scapular Bone Region No depletion  Dorsal Hand No depletion  Patellar Region No depletion  Anterior Thigh Region No depletion  Posterior Calf Region No depletion  Edema (RD Assessment) None  Hair Reviewed  Eyes Reviewed  Mouth Reviewed  Skin Reviewed  Nails Reviewed  Diet Order:   Diet Order             Diet regular Fluid consistency: Thin  Diet effective now                   EDUCATION NEEDS:   Education needs have been addressed  Skin:  Skin Assessment: Reviewed RN Assessment  Last BM:  Unknown  Height:   Ht Readings from Last 1 Encounters:  01/16/23 5' (1.524 m)    Weight:   Wt Readings from Last 1 Encounters:  01/17/23 105.9 kg    Ideal Body Weight:   45.5 kg  BMI:  Body mass index is 45.58 kg/m.  Estimated Nutritional Needs:   Kcal:  1600-1800  Protein:  90-105 grams  Fluid:  > 1.6 L    Levada Schilling, RD, LDN, CDCES Registered Dietitian II Certified Diabetes Care and Education Specialist Please refer to [facility] for RD and/or RD on-call/weekend/after hours pager

## 2023-01-17 NOTE — Progress Notes (Addendum)
Progress Note   Patient: Brittney Sullivan ZOX:[id number] DOB: 05-28-1960 DOA: 01/16/2023     1 DOS: the patient was seen and examined on 01/17/2023   Brief hospital course: PACIE SCHLACK is a 62 y.o. female with medical history significant of GERD, essential hypertension, allergic rhinitis, previous brain surgery, migraine headaches, who presented to the ER with nausea vomiting, diarrhea, dizziness and headache. Weakness, cough, poor appetite, eating and drinking poor. She is found to AKI with Creatinine 5, COVID 19 positive admitted to hospitalist service for further management.  Assessment and Plan: AKI:  Prerenal secondary to dehydration.   Due to her GI symptoms of nausea vomiting diarrhea.   Hold lisinopril and diuretic for now.   Continue IV hydration. Avoid nephrotoxic drugs. Follow renal function.  Encourage oral intake.     COVID-19 infection:  She does not have shortness of breath, cough or hypoxia.  Her symptoms seems to be GI related.   No need for steroids or remdesivir. Continue special airborne contact precautions.   Morbid obesity:  BMI 45.5. Diet, exercise and weight reduction counseled   Hyponatremia: Possibly due to dehydration.  Continue to monitor. Sodium 134 today.   Elevated lipase: No evidence of pancreatitis.  CT abdomen pelvis was normal.   GERD: Continue with PPIs   Essential hypertension:  BP lower side, hold lisinopril and diuretic. IV hydralazine as needed for elevated blood pressures.        Subjective: Patient is seen and examined today morning. Does have nausea, poor appetite, metallic taste, weak. Denies cough, sore throat, fever. Encouraged incentive spirometry, out of bed to chair.  Physical Exam: Vitals:   01/17/23 0135 01/17/23 0700 01/17/23 0754 01/17/23 1557  BP: (!) 100/54  (!) 94/53 (!) 111/59  Pulse: 79  73 62  Resp: 20  18 18   Temp: 97.6 F (36.4 C)  97.6 F (36.4 C) 97.8 F (36.6 C)  TempSrc: Oral  Oral Oral  SpO2: 100%   100% 98%  Weight:  105.9 kg    Height:       General -Elderly morbidly obese African American ill female, no apparent distress HEENT - PERRLA, EOMI, atraumatic head, non tender sinuses. Lung -bibasilar Rales Heart - S1, S2 heard, no murmurs, rubs, 1+ pedal edema Neuro - Alert, awake and oriented x 3, non focal exam. Skin - Warm and dry. Bilateral arm slings noted. Data Reviewed:     Latest Ref Rng & Units 01/17/2023    5:20 AM 01/16/2023    5:50 PM 01/15/2019    9:39 AM  CBC  WBC 4.0 - 10.5 K/uL 7.9  8.8  6.9   Hemoglobin 12.0 - 15.0 g/dL 81.1  91.4  78.2   Hematocrit 36.0 - 46.0 % 37.7  43.7  38.5   Platelets 150 - 400 K/uL 257  335  333       Latest Ref Rng & Units 01/17/2023    5:20 AM 01/16/2023    5:50 PM 01/15/2019    9:39 AM  BMP  Glucose 70 - 99 mg/dL 956  213  086   BUN 8 - 23 mg/dL 94  94  33   Creatinine 0.44 - 1.00 mg/dL 5.78  4.69  6.29   BUN/Creat Ratio 9 - 23   18   Sodium 135 - 145 mmol/L 134  133  139   Potassium 3.5 - 5.1 mmol/L 3.5  3.7  4.4   Chloride 98 - 111 mmol/L 105  99  102  CO2 22 - 32 mmol/L 18  18  21    Calcium 8.9 - 10.3 mg/dL 8.2  9.3  9.7    CT ABDOMEN PELVIS WO CONTRAST  Result Date: 01/16/2023 CLINICAL DATA:  Acute nonlocalized abdominal pain. Nausea, vomiting, and diarrhea for 9 days. Dizziness and weakness. EXAM: CT ABDOMEN AND PELVIS WITHOUT CONTRAST TECHNIQUE: Multidetector CT imaging of the abdomen and pelvis was performed following the standard protocol without IV contrast. RADIATION DOSE REDUCTION: This exam was performed according to the departmental dose-optimization program which includes automated exposure control, adjustment of the mA and/or kV according to patient size and/or use of iterative reconstruction technique. COMPARISON:  05/23/2005 FINDINGS: Lower chest: No acute abnormality. Hepatobiliary: No focal liver lesions. Gallbladder is distended. No wall thickening or stones identified. No bile duct dilatation. Pancreas: Unremarkable. No  pancreatic ductal dilatation or surrounding inflammatory changes. Spleen: Normal in size without focal abnormality. Adrenals/Urinary Tract: Adrenal glands are unremarkable. Kidneys are normal, without renal calculi, focal lesion, or hydronephrosis. Bladder is unremarkable. Stomach/Bowel: Stomach is within normal limits. Appendix appears normal. No evidence of bowel wall thickening, distention, or inflammatory changes. Vascular/Lymphatic: No significant vascular findings are present. No enlarged abdominal or pelvic lymph nodes. Reproductive: Uterus and bilateral adnexa are unremarkable. Other: No abdominal wall hernia or abnormality. No abdominopelvic ascites. Musculoskeletal: No acute or significant osseous findings. IMPRESSION: Distended gallbladder without stone or wall thickening. This is nonspecific but could indicate decreased gallbladder function. Consider radionuclide biliary scan for evaluation of gallbladder function. No bowel obstruction or inflammation. Electronically Signed   By: Burman Nieves M.D.   On: 01/16/2023 19:56     Family Communication: Family at bedside understand and agree with current plan.  Disposition: Status is: Inpatient Remains inpatient appropriate because: severe AKI, COVID related GI symptoms.  Planned Discharge Destination: Home with Home Health    Time spent: 45 minutes  Author: Marcelino Duster, MD 01/17/2023 4:52 PM  For on call review [email or web address].

## 2023-01-17 NOTE — Plan of Care (Signed)

## 2023-01-17 NOTE — Plan of Care (Signed)
  Problem: Education: Goal: Knowledge of General Education information will improve Description: Including pain rating scale, medication(s)/side effects and non-pharmacologic comfort measures Outcome: Progressing   Problem: Pain Managment: Goal: General experience of comfort will improve Outcome: Progressing   Problem: Safety: Goal: Ability to remain free from injury will improve Outcome: Progressing   

## 2023-01-18 DIAGNOSIS — K219 Gastro-esophageal reflux disease without esophagitis: Secondary | ICD-10-CM | POA: Diagnosis not present

## 2023-01-18 DIAGNOSIS — N179 Acute kidney failure, unspecified: Secondary | ICD-10-CM | POA: Diagnosis not present

## 2023-01-18 DIAGNOSIS — U071 COVID-19: Secondary | ICD-10-CM | POA: Diagnosis not present

## 2023-01-18 DIAGNOSIS — E871 Hypo-osmolality and hyponatremia: Secondary | ICD-10-CM | POA: Diagnosis not present

## 2023-01-18 LAB — URINALYSIS, ROUTINE W REFLEX MICROSCOPIC
Bilirubin Urine: NEGATIVE
Glucose, UA: NEGATIVE mg/dL
Hgb urine dipstick: NEGATIVE
Ketones, ur: NEGATIVE mg/dL
Nitrite: NEGATIVE
Protein, ur: NEGATIVE mg/dL
Specific Gravity, Urine: 1.01 (ref 1.005–1.030)
pH: 5 (ref 5.0–8.0)

## 2023-01-18 LAB — BASIC METABOLIC PANEL
Anion gap: 8 (ref 5–15)
BUN: 62 mg/dL — ABNORMAL HIGH (ref 8–23)
CO2: 18 mmol/L — ABNORMAL LOW (ref 22–32)
Calcium: 8.7 mg/dL — ABNORMAL LOW (ref 8.9–10.3)
Chloride: 114 mmol/L — ABNORMAL HIGH (ref 98–111)
Creatinine, Ser: 2.51 mg/dL — ABNORMAL HIGH (ref 0.44–1.00)
GFR, Estimated: 21 mL/min — ABNORMAL LOW (ref >60)
Glucose, Bld: 122 mg/dL — ABNORMAL HIGH (ref 70–99)
Potassium: 3.6 mmol/L (ref 3.5–5.1)
Sodium: 140 mmol/L (ref 135–145)

## 2023-01-18 MED ORDER — PANTOPRAZOLE SODIUM 40 MG PO TBEC
40.0000 mg | DELAYED_RELEASE_TABLET | Freq: Every day | ORAL | Status: DC
  Administered 2023-01-19 – 2023-01-21 (×3): 40 mg via ORAL
  Filled 2023-01-18 (×4): qty 1

## 2023-01-18 MED ORDER — PANTOPRAZOLE SODIUM 40 MG IV SOLR: 40.0000 mg | INTRAVENOUS | Status: DC

## 2023-01-18 NOTE — Plan of Care (Signed)
  Problem: Nutrition: Goal: Adequate nutrition will be maintained Outcome: Progressing   Problem: Safety: Goal: Ability to remain free from injury will improve Outcome: Progressing   

## 2023-01-18 NOTE — Progress Notes (Signed)
Progress Note   Patient: Brittney Sullivan:[id number] DOB: 03/11/1961 DOA: 01/16/2023     2 DOS: the patient was seen and examined on 01/18/2023   Brief hospital course: Brittney Sullivan is a 62 y.o. female with medical history significant of GERD, essential hypertension, allergic rhinitis, previous brain surgery, migraine headaches, who presented to the ER with nausea vomiting, diarrhea, dizziness and headache. Weakness, cough, poor appetite, eating and drinking poor. She is found to AKI with Creatinine 5, COVID 19 positive admitted to hospitalist service for further management.  Assessment and Plan: AKI:  Prerenal secondary to dehydration due to her GI symptoms of nausea vomiting diarrhea.   Her kidney function is improving. Hold lisinopril and diuretic for now.   Continue IV hydration.  Encourage oral diet. Avoid nephrotoxic drugs. Continue to follow daily renal function, electrolytes.   COVID-19 infection:  She does not have shortness of breath, cough or hypoxia.  Her symptoms seems to be GI related.   No need for steroids or remdesivir. Continue special airborne contact precautions.   Morbid obesity:  BMI 45.5. Diet, exercise and weight reduction counseled   Hyponatremia: Possibly due to dehydration.  Continue to monitor. Sodium improved with fluids.   Elevated lipase: No evidence of pancreatitis.  CT abdomen pelvis was normal.   GERD: Continue with PPIs   Essential hypertension:  BP better today. Continue to hold lisinopril and diuretic. IV hydralazine as needed for elevated blood pressures.        Subjective: Patient is seen and examined today morning.  She felt dizzy while working with PT.  States she has metallic taste, unable to tolerate diet.  Does have some epigastric discomfort attributes to acid reflux.  Physical Exam: Vitals:   01/18/23 0252 01/18/23 0803 01/18/23 0948 01/18/23 1158  BP: 107/62 (!) 113/55  123/65  Pulse: 65 66  65  Resp: 18 18  16   Temp:  98.1 F (36.7 C) 97.8 F (36.6 C)  (!) 97.5 F (36.4 C)  TempSrc: Oral   Oral  SpO2: 98% 100%  96%  Weight:   106.9 kg   Height:       General -Elderly morbidly obese African American ill female, distress due to nausea, metallic taste HEENT - PERRLA, EOMI, atraumatic head, non tender sinuses. Lung -bibasilar Rales Heart - S1, S2 heard, no murmurs, rubs, 1+ pedal edema Neuro - Alert, awake and oriented x 3, non focal exam. Skin - Warm and dry.  Data Reviewed:     Latest Ref Rng & Units 01/17/2023    5:20 AM 01/16/2023    5:50 PM 01/15/2019    9:39 AM  CBC  WBC 4.0 - 10.5 K/uL 7.9  8.8  6.9   Hemoglobin 12.0 - 15.0 g/dL 81.1  91.4  78.2   Hematocrit 36.0 - 46.0 % 37.7  43.7  38.5   Platelets 150 - 400 K/uL 257  335  333       Latest Ref Rng & Units 01/18/2023   10:01 AM 01/17/2023    5:20 AM 01/16/2023    5:50 PM  BMP  Glucose 70 - 99 mg/dL 956  213  086   BUN 8 - 23 mg/dL 62  94  94   Creatinine 0.44 - 1.00 mg/dL 5.78  4.69  6.29   Sodium 135 - 145 mmol/L 140  134  133   Potassium 3.5 - 5.1 mmol/L 3.6  3.5  3.7   Chloride 98 - 111 mmol/L 114  105  99   CO2 22 - 32 mmol/L 18  18  18    Calcium 8.9 - 10.3 mg/dL 8.7  8.2  9.3    CT ABDOMEN PELVIS WO CONTRAST  Result Date: 01/16/2023 CLINICAL DATA:  Acute nonlocalized abdominal pain. Nausea, vomiting, and diarrhea for 9 days. Dizziness and weakness. EXAM: CT ABDOMEN AND PELVIS WITHOUT CONTRAST TECHNIQUE: Multidetector CT imaging of the abdomen and pelvis was performed following the standard protocol without IV contrast. RADIATION DOSE REDUCTION: This exam was performed according to the departmental dose-optimization program which includes automated exposure control, adjustment of the mA and/or kV according to patient size and/or use of iterative reconstruction technique. COMPARISON:  05/23/2005 FINDINGS: Lower chest: No acute abnormality. Hepatobiliary: No focal liver lesions. Gallbladder is distended. No wall thickening or stones  identified. No bile duct dilatation. Pancreas: Unremarkable. No pancreatic ductal dilatation or surrounding inflammatory changes. Spleen: Normal in size without focal abnormality. Adrenals/Urinary Tract: Adrenal glands are unremarkable. Kidneys are normal, without renal calculi, focal lesion, or hydronephrosis. Bladder is unremarkable. Stomach/Bowel: Stomach is within normal limits. Appendix appears normal. No evidence of bowel wall thickening, distention, or inflammatory changes. Vascular/Lymphatic: No significant vascular findings are present. No enlarged abdominal or pelvic lymph nodes. Reproductive: Uterus and bilateral adnexa are unremarkable. Other: No abdominal wall hernia or abnormality. No abdominopelvic ascites. Musculoskeletal: No acute or significant osseous findings. IMPRESSION: Distended gallbladder without stone or wall thickening. This is nonspecific but could indicate decreased gallbladder function. Consider radionuclide biliary scan for evaluation of gallbladder function. No bowel obstruction or inflammation. Electronically Signed   By: Burman Nieves M.D.   On: 01/16/2023 19:56     Family Communication: Family at bedside understand and agree with current plan.  Disposition: Status is: Inpatient Remains inpatient appropriate because: severe AKI, COVID related GI symptoms.  Planned Discharge Destination: Home with Home Health    Time spent: 43 minutes  Author: Marcelino Duster, MD 01/18/2023 4:09 PM  For on call review [email or web address].

## 2023-01-18 NOTE — Plan of Care (Signed)
  Problem: Clinical Measurements: Goal: Respiratory complications will improve Outcome: Progressing   

## 2023-01-18 NOTE — TOC CM/SW Note (Signed)
Transition of Care [facility] - Inpatient Brief Assessment   Patient Details  Name: LACOLE CUMBO MRN: [id number] Date of Birth: [birth year]  Transition of Care [facility]) CM/SW Contact:    Chapman Fitch, RN Phone Number: 01/18/2023, 9:03 AM   Clinical Narrative:  Transition of Care [facility]) Screening Note   Patient Details  Name: MICHALENA DELBIANCO Date of Birth: 12-28-1960   Transition of Care [facility]) CM/SW Contact:    Chapman Fitch, RN Phone Number: 01/18/2023, 9:03 AM    Transition of Care Department [facility]) has reviewed patient and no TOC needs have been identified at this time. We will continue to monitor patient advancement through interdisciplinary progression rounds. If new patient transition needs arise, please place a TOC consult.     Transition of Care Asessment: Insurance and Status: Insurance coverage has been reviewed Patient has primary care physician: Yes (Per Care [facility] Dr Yetta Barre)     Prior/Current Home Services: No current home services Social Determinants of Health Reivew: SDOH reviewed no interventions necessary Readmission risk has been reviewed: Yes Transition of care needs: no transition of care needs at this time

## 2023-01-18 NOTE — Progress Notes (Signed)
Mobility Specialist - Progress Note     01/18/23 1100  Mobility  Activity Ambulated with assistance in hallway  Level of Assistance Contact guard assist, steadying assist  Assistive Device Other (Comment) (HHA)  Distance Ambulated (ft) 30 ft  Range of Motion/Exercises Active  Activity Response Tolerated well  Mobility Referral Yes  $Mobility charge 1 Mobility  Mobility Specialist Start Time (ACUTE ONLY) 1100  Mobility Specialist Stop Time (ACUTE ONLY) 1137  Mobility Specialist Time Calculation (min) (ACUTE ONLY) 37 min   Pt resting in bed on RA upon entry. Pt STS and ambulates to hallway HHA CGA due to fatigue. Pt began to feel really week at 30 ft and endorsed heavy dizziness. Author initiated immediate seated rest break and wheeled back to room. Pt returned to bed and left with needs in reach. Pt still endorsed dizziness while in bed, RN notified.   Johnathan Hausen Mobility Specialist 01/18/23, 11:55 AM

## 2023-01-19 DIAGNOSIS — N179 Acute kidney failure, unspecified: Secondary | ICD-10-CM | POA: Diagnosis not present

## 2023-01-19 DIAGNOSIS — K219 Gastro-esophageal reflux disease without esophagitis: Secondary | ICD-10-CM | POA: Diagnosis not present

## 2023-01-19 DIAGNOSIS — E871 Hypo-osmolality and hyponatremia: Secondary | ICD-10-CM | POA: Diagnosis not present

## 2023-01-19 DIAGNOSIS — U071 COVID-19: Secondary | ICD-10-CM | POA: Diagnosis not present

## 2023-01-19 LAB — BASIC METABOLIC PANEL
Anion gap: 7 (ref 5–15)
BUN: 48 mg/dL — ABNORMAL HIGH (ref 8–23)
CO2: 20 mmol/L — ABNORMAL LOW (ref 22–32)
Calcium: 8.7 mg/dL — ABNORMAL LOW (ref 8.9–10.3)
Chloride: 113 mmol/L — ABNORMAL HIGH (ref 98–111)
Creatinine, Ser: 1.94 mg/dL — ABNORMAL HIGH (ref 0.44–1.00)
GFR, Estimated: 29 mL/min — ABNORMAL LOW (ref >60)
Glucose, Bld: 105 mg/dL — ABNORMAL HIGH (ref 70–99)
Potassium: 3.5 mmol/L (ref 3.5–5.1)
Sodium: 140 mmol/L (ref 135–145)

## 2023-01-19 MED ORDER — PHENOL 1.4 % MT LIQD: 1.0000 | OROMUCOSAL | Status: DC | PRN

## 2023-01-19 MED ORDER — HYDRALAZINE HCL 20 MG/ML IJ SOLN: 10.0000 mg | Freq: Four times a day (QID) | INTRAMUSCULAR | Status: DC | PRN

## 2023-01-19 NOTE — Plan of Care (Signed)

## 2023-01-19 NOTE — Progress Notes (Signed)
Progress Note   Patient: Brittney Sullivan ZOX:[id number] DOB: 1960-05-31 DOA: 01/16/2023     3 DOS: the patient was seen and examined on 01/19/2023   Brief hospital course: Brittney Sullivan is a 62 y.o. female with medical history significant of GERD, essential hypertension, allergic rhinitis, previous brain surgery, migraine headaches, who presented to the ER with nausea vomiting, diarrhea, dizziness and headache. Weakness, cough, poor appetite, eating and drinking poor. She is found to AKI with Creatinine 5, COVID 19 positive admitted to hospitalist service for further management.  Assessment and Plan: AKI:  Prerenal secondary to dehydration due to her GI symptoms of nausea vomiting diarrhea.   Her kidney function is improving. Creatinine improved to 1.94 from 5 with IV fluids.  Continue to hold lisinopril and diuretic for now.   Gentle IV hydration.  Encourage oral diet, supplements. Avoid nephrotoxic drugs. Continue to follow daily renal function, electrolytes.   COVID-19 infection:  She does not have shortness of breath, cough or hypoxia.  Her symptoms seems to be GI related. More metallic taste, nausea. Diarrhea better.  No need for steroids or remdesivir. Continue special airborne contact precautions.   Morbid obesity:  BMI 45.5. Diet, exercise and weight reduction counseled   Hyponatremia: Possibly due to dehydration.  Trend Na. Sodium improved with fluids.   Elevated lipase: No evidence of pancreatitis.   CT abdomen pelvis was normal. Repeat lipase level ordered   GERD: IV PPI ordered.   Essential hypertension:  BP better today. Continue to hold lisinopril and diuretic. IV hydralazine as needed for elevated blood pressures.        Subjective: Patient is seen and examined today morning.  She has persistent metallic taste, nausea unable to tolerate diet.  Does have some dizziness. I encouraged her to eat, use gingerale, mouth rinse.   Physical Exam: Vitals:   01/19/23  0429 01/19/23 0500 01/19/23 0754 01/19/23 1615  BP: 138/73  122/64 (!) 143/74  Pulse: 72  66 64  Resp: 16  18 16   Temp: 98.2 F (36.8 C)  98 F (36.7 C) 98.2 F (36.8 C)  TempSrc:   Oral Oral  SpO2: 98%  99% 100%  Weight:  106.9 kg    Height:       General -Elderly morbidly obese African American ill female, distress due to nausea, metallic taste, dizziness HEENT - PERRLA, EOMI, atraumatic head, non tender sinuses. Lung -bibasilar Rales Heart - S1, S2 heard, no murmurs, rubs, 1+ pedal edema Neuro - Alert, awake and oriented x 3, non focal exam. Skin - Warm and dry.  Data Reviewed:     Latest Ref Rng & Units 01/17/2023    5:20 AM 01/16/2023    5:50 PM 01/15/2019    9:39 AM  CBC  WBC 4.0 - 10.5 K/uL 7.9  8.8  6.9   Hemoglobin 12.0 - 15.0 g/dL 81.1  91.4  78.2   Hematocrit 36.0 - 46.0 % 37.7  43.7  38.5   Platelets 150 - 400 K/uL 257  335  333       Latest Ref Rng & Units 01/19/2023    4:48 AM 01/18/2023   10:01 AM 01/17/2023    5:20 AM  BMP  Glucose 70 - 99 mg/dL 956  213  086   BUN 8 - 23 mg/dL 48  62  94   Creatinine 0.44 - 1.00 mg/dL 5.78  4.69  6.29   Sodium 135 - 145 mmol/L 140  140  134  Potassium 3.5 - 5.1 mmol/L 3.5  3.6  3.5   Chloride 98 - 111 mmol/L 113  114  105   CO2 22 - 32 mmol/L 20  18  18    Calcium 8.9 - 10.3 mg/dL 8.7  8.7  8.2    No results found.   Family Communication: Family at bedside understand and agree with current plan.  Disposition: Status is: Inpatient Remains inpatient appropriate because: severe AKI, COVID related GI symptoms.  Planned Discharge Destination: Home with Home Health    Time spent: 41 minutes  Author: Marcelino Duster, MD 01/19/2023 5:13 PM  For on call review [email or web address].

## 2023-01-20 DIAGNOSIS — N179 Acute kidney failure, unspecified: Secondary | ICD-10-CM | POA: Diagnosis not present

## 2023-01-20 DIAGNOSIS — E871 Hypo-osmolality and hyponatremia: Secondary | ICD-10-CM | POA: Diagnosis not present

## 2023-01-20 DIAGNOSIS — U071 COVID-19: Secondary | ICD-10-CM | POA: Diagnosis not present

## 2023-01-20 DIAGNOSIS — K219 Gastro-esophageal reflux disease without esophagitis: Secondary | ICD-10-CM | POA: Diagnosis not present

## 2023-01-20 LAB — BASIC METABOLIC PANEL
Anion gap: 9 (ref 5–15)
BUN: 31 mg/dL — ABNORMAL HIGH (ref 8–23)
CO2: 21 mmol/L — ABNORMAL LOW (ref 22–32)
Calcium: 8.9 mg/dL (ref 8.9–10.3)
Chloride: 110 mmol/L (ref 98–111)
Creatinine, Ser: 1.59 mg/dL — ABNORMAL HIGH (ref 0.44–1.00)
GFR, Estimated: 37 mL/min — ABNORMAL LOW (ref >60)
Glucose, Bld: 107 mg/dL — ABNORMAL HIGH (ref 70–99)
Potassium: 3.4 mmol/L — ABNORMAL LOW (ref 3.5–5.1)
Sodium: 140 mmol/L (ref 135–145)

## 2023-01-20 LAB — LIPASE, BLOOD: Lipase: 57 U/L — ABNORMAL HIGH (ref 11–51)

## 2023-01-20 MED ORDER — LACTATED RINGERS IV SOLN: INTRAVENOUS | Status: DC

## 2023-01-20 NOTE — Progress Notes (Signed)
Mobility Specialist - Progress Note    01/20/23 1313  Mobility  Activity Stood at bedside;Ambulated with assistance in hallway  Level of Assistance Contact guard assist, steadying assist  Assistive Device Other (Comment) (IV Pole)  Distance Ambulated (ft) 30 ft  Range of Motion/Exercises Active  Activity Response Tolerated well  Mobility Referral Yes  $Mobility charge 1 Mobility  Mobility Specialist Start Time (ACUTE ONLY) 1258  Mobility Specialist Stop Time (ACUTE ONLY) 1313  Mobility Specialist Time Calculation (min) (ACUTE ONLY) 15 min   Pt resting in bed on RA upon entry. Pt STS and ambulates to hallway CGA holding onto IV pole for stability. Pt initially endorses dizziness but says it subsided after 1 minute standing. Pt took x2 standing rest break during ambulation session and gait is very slow moving. Pt returned to bed to rest EOB endorses fatigue. Pt given education on the importance of trying to eat a little and letting staff know if she needs something for nausea. Pt returned to bed and left with needs in reach.   Brittney Sullivan Mobility Specialist 01/20/23, 1:27 PM

## 2023-01-20 NOTE — Progress Notes (Signed)
Progress Note   Patient: Brittney Sullivan QMV:[id number] DOB: 29-Apr-1961 DOA: 01/16/2023     4 DOS: the patient was seen and examined on 01/20/2023   Brief hospital course: Brittney Sullivan is a 62 y.o. female with medical history significant of GERD, essential hypertension, allergic rhinitis, previous brain surgery, migraine headaches, who presented to the ER with nausea vomiting, diarrhea, dizziness and headache. Weakness, cough, poor appetite, eating and drinking poor. She is found to AKI with Creatinine 5, COVID 19 positive admitted to hospitalist service for further management.  Assessment and Plan: AKI:  Prerenal secondary to dehydration due to her GI symptoms of nausea vomiting diarrhea.   Her kidney function is improving. Creatinine improved to 1.94 from 5 with IV fluids.  Continue to hold lisinopril and diuretic for now.   Gentle IV hydration as she is not eating well.  Encourage oral diet, supplements. Avoid nephrotoxic drugs. Continue to follow daily renal function, electrolytes.   COVID-19 infection:  She does not have shortness of breath, cough or hypoxia.  Her symptoms more metallic taste, low appetite, weakness. Nausea, diarrhea better.  Dietician eval ordered. No need for steroids or remdesivir. Continue special airborne contact precautions.   Morbid obesity: BMI 46.03 Diet, exercise and weight reduction counseled   Hyponatremia: Possibly due to dehydration.  Sodium improved with fluids.   Elevated lipase: No evidence of pancreatitis.   CT abdomen pelvis was normal. Repeat lipase level.   GERD: IV PPI ordered.   Essential hypertension:  Continue to hold lisinopril and diuretic. IV hydralazine as needed for elevated blood pressures.        Subjective: Patient is seen and examined today morning.  She has persistent metallic taste, nausea better but unable to eat or drink.  Dizziness improved, able to get out of bed.  Physical Exam: Vitals:   01/19/23 1615 01/19/23  2027 01/20/23 0348 01/20/23 0907  BP: (!) 143/74 (!) 150/67 135/66 136/86  Pulse: 64 62 69 60  Resp: 16 18 18 18   Temp: 98.2 F (36.8 C) 98 F (36.7 C) 98.1 F (36.7 C) (!) 97.5 F (36.4 C)  TempSrc: Oral Oral Oral Oral  SpO2: 100% 99% 98% 97%  Weight:      Height:       General -Elderly morbidly obese African American ill female, distress due to metallic taste HEENT - PERRLA, EOMI, atraumatic head, non tender sinuses. Lung -bibasilar Rales Heart - S1, S2 heard, no murmurs, rubs, 1+ pedal edema Neuro - Alert, awake and oriented x 3, non focal exam. Skin - Warm and dry.  Data Reviewed:     Latest Ref Rng & Units 01/17/2023    5:20 AM 01/16/2023    5:50 PM 01/15/2019    9:39 AM  CBC  WBC 4.0 - 10.5 K/uL 7.9  8.8  6.9   Hemoglobin 12.0 - 15.0 g/dL 28.4  13.2  44.0   Hematocrit 36.0 - 46.0 % 37.7  43.7  38.5   Platelets 150 - 400 K/uL 257  335  333       Latest Ref Rng & Units 01/20/2023    4:56 AM 01/19/2023    4:48 AM 01/18/2023   10:01 AM  BMP  Glucose 70 - 99 mg/dL 102  725  366   BUN 8 - 23 mg/dL 31  48  62   Creatinine 0.44 - 1.00 mg/dL 4.40  3.47  4.25   Sodium 135 - 145 mmol/L 140  140  140  Potassium 3.5 - 5.1 mmol/L 3.4  3.5  3.6   Chloride 98 - 111 mmol/L 110  113  114   CO2 22 - 32 mmol/L 21  20  18    Calcium 8.9 - 10.3 mg/dL 8.9  8.7  8.7    No results found.   Family Communication: Family at bedside understand and agree with current plan.  Disposition: Status is: Inpatient Remains inpatient appropriate because: severe AKI, COVID related GI symptoms.  Planned Discharge Destination: Home with Home Health    Time spent: 43 minutes  Author: Marcelino Duster, MD 01/20/2023 11:14 AM  For on call review [email or web address].

## 2023-01-21 DIAGNOSIS — E871 Hypo-osmolality and hyponatremia: Secondary | ICD-10-CM | POA: Diagnosis not present

## 2023-01-21 DIAGNOSIS — R531 Weakness: Secondary | ICD-10-CM

## 2023-01-21 DIAGNOSIS — K219 Gastro-esophageal reflux disease without esophagitis: Secondary | ICD-10-CM | POA: Diagnosis not present

## 2023-01-21 DIAGNOSIS — U071 COVID-19: Secondary | ICD-10-CM | POA: Diagnosis not present

## 2023-01-21 DIAGNOSIS — N179 Acute kidney failure, unspecified: Secondary | ICD-10-CM | POA: Diagnosis not present

## 2023-01-21 LAB — BASIC METABOLIC PANEL
Anion gap: 9 (ref 5–15)
BUN: 18 mg/dL (ref 8–23)
CO2: 21 mmol/L — ABNORMAL LOW (ref 22–32)
Calcium: 9 mg/dL (ref 8.9–10.3)
Chloride: 110 mmol/L (ref 98–111)
Creatinine, Ser: 1.42 mg/dL — ABNORMAL HIGH (ref 0.44–1.00)
GFR, Estimated: 42 mL/min — ABNORMAL LOW (ref >60)
Glucose, Bld: 104 mg/dL — ABNORMAL HIGH (ref 70–99)
Potassium: 3.6 mmol/L (ref 3.5–5.1)
Sodium: 140 mmol/L (ref 135–145)

## 2023-01-21 MED ORDER — PANTOPRAZOLE SODIUM 40 MG PO TBEC: 40.0000 mg | DELAYED_RELEASE_TABLET | Freq: Every day | ORAL | 3 refills | Status: DC

## 2023-01-21 MED ORDER — ONDANSETRON 4 MG PO TBDP: 4.0000 mg | ORAL_TABLET | Freq: Three times a day (TID) | ORAL | 0 refills | Status: DC | PRN

## 2023-01-21 MED ORDER — ENSURE ENLIVE PO LIQD: 237.0000 mL | Freq: Three times a day (TID) | ORAL | 12 refills | Status: DC

## 2023-01-21 NOTE — Progress Notes (Signed)
Mobility Specialist - Progress Note      01/21/23 0900  Mobility  Activity Ambulated with assistance in hallway  Level of Assistance Contact guard assist, steadying assist  Assistive Device Other (Comment) (IV Pole)  Distance Ambulated (ft) 80 ft  Range of Motion/Exercises Active  Activity Response Tolerated well  Mobility Referral Yes  $Mobility charge 1 Mobility  Mobility Specialist Start Time (ACUTE ONLY) 0830  Mobility Specialist Stop Time (ACUTE ONLY) 0902  Mobility Specialist Time Calculation (min) (ACUTE ONLY) 32 min   Pt resting in bed on RA upon entry. Pt STS and ambulates to hallway around NS CGA holding onto IV Pole. Pt endorsed fatigue and lightheadedness after 70ft. Pt took x1 standing rest break. Pt returned to bed and left with needs in reach. Pt breakfast given.   Brittney Sullivan Mobility Specialist 01/21/23, 9:24 AM

## 2023-01-21 NOTE — Group Note (Deleted)

## 2023-01-21 NOTE — Plan of Care (Signed)
Adequate for discharge. Resolving plan of care.  Brittney Sullivan  

## 2023-01-21 NOTE — Progress Notes (Signed)
IV removed without complications. Family assisting patient to get dressed.  Cornell Barman Thedora Rings

## 2023-01-21 NOTE — Discharge Summary (Signed)
Physician Discharge Summary   Patient: Brittney Sullivan MRN: [id number] DOB: 02-07-1961  Admit date:     01/16/2023  Discharge date: 01/21/23  Discharge Physician: Marcelino Duster   PCP: Pcp, No   Recommendations at discharge:  {Tip this will not be part of the note when signed- Example include specific recommendations for outpatient follow-up, pending tests to follow-up on. (Optional):26781}  ***  Discharge Diagnoses: Principal Problem:   AKI (acute kidney injury) (HCC) Active Problems:   Esophageal reflux   Morbid obesity (HCC)   COVID-19 virus infection   Hyponatremia  Resolved Problems:   * No resolved hospital problems. [facility] Course: No notes on file  Assessment and Plan: No notes have been filed under this hospital service. Service: Hospitalist     {Tip this will not be part of the note when signed Body mass index is 45.64 kg/m. ,  Nutrition Documentation    Flowsheet Row ED to Hosp-Admission (Current) from 01/16/2023 in [facility]  Nutrition Problem Increased nutrient needs  Etiology acute illness  Nutrition Goal Patient will meet greater than or equal to 90% of their needs  Interventions Ensure Enlive (each supplement provides 350kcal and 20 grams of protein), Magic cup, MVI, Liberalize Diet     ,  (Optional):26781}  {(NOTE) Pain control PDMP Statment (Optional):26782} Consultants: *** Procedures performed: ***  Disposition: {Plan; Disposition:26390} Diet recommendation:  Discharge Diet Orders (From admission, onward)     Start     Ordered   01/21/23 0000  Diet - low sodium heart healthy        01/21/23 1214           {Diet_Plan:26776} DISCHARGE MEDICATION: Allergies as of 01/21/2023       Reactions   Alprazolam    Other reaction(s): Headache Difficulty breathing        Medication List     STOP taking these medications    lisinopril 10 MG tablet Commonly known as: ZESTRIL    triamterene-hydrochlorothiazide 75-50 MG tablet Commonly known as: MAXZIDE       TAKE these medications    Excedrin Migraine 250-250-65 MG tablet Generic drug: aspirin-acetaminophen-caffeine Take by mouth.   feeding supplement Liqd Take 237 mLs by mouth 3 (three) times daily between meals.   ondansetron 4 MG disintegrating tablet Commonly known as: ZOFRAN-ODT Take 1 tablet (4 mg total) by mouth every 8 (eight) hours as needed for nausea or vomiting.   pantoprazole 40 MG tablet Commonly known as: PROTONIX Take 1 tablet (40 mg total) by mouth daily. Start taking on: January 22, 2023   phentermine 15 MG capsule Take 15 mg by mouth every morning.   topiramate 25 MG tablet Commonly known as: TOPAMAX Take 25 mg by mouth at bedtime.        Discharge Exam: Filed Weights   01/18/23 0948 01/19/23 0500 01/21/23 0425  Weight: 106.9 kg 106.9 kg 106 kg   ***  Condition at discharge: {DC Condition:26389}  The results of significant diagnostics from this hospitalization (including imaging, microbiology, ancillary and laboratory) are listed below for reference.   Imaging Studies: CT ABDOMEN PELVIS WO CONTRAST  Result Date: 01/16/2023 CLINICAL DATA:  Acute nonlocalized abdominal pain. Nausea, vomiting, and diarrhea for 9 days. Dizziness and weakness. EXAM: CT ABDOMEN AND PELVIS WITHOUT CONTRAST TECHNIQUE: Multidetector CT imaging of the abdomen and pelvis was performed following the standard protocol without IV contrast. RADIATION DOSE REDUCTION: This exam was performed according to the departmental dose-optimization program  which includes automated exposure control, adjustment of the mA and/or kV according to patient size and/or use of iterative reconstruction technique. COMPARISON:  05/23/2005 FINDINGS: Lower chest: No acute abnormality. Hepatobiliary: No focal liver lesions. Gallbladder is distended. No wall thickening or stones identified. No bile duct dilatation. Pancreas:  Unremarkable. No pancreatic ductal dilatation or surrounding inflammatory changes. Spleen: Normal in size without focal abnormality. Adrenals/Urinary Tract: Adrenal glands are unremarkable. Kidneys are normal, without renal calculi, focal lesion, or hydronephrosis. Bladder is unremarkable. Stomach/Bowel: Stomach is within normal limits. Appendix appears normal. No evidence of bowel wall thickening, distention, or inflammatory changes. Vascular/Lymphatic: No significant vascular findings are present. No enlarged abdominal or pelvic lymph nodes. Reproductive: Uterus and bilateral adnexa are unremarkable. Other: No abdominal wall hernia or abnormality. No abdominopelvic ascites. Musculoskeletal: No acute or significant osseous findings. IMPRESSION: Distended gallbladder without stone or wall thickening. This is nonspecific but could indicate decreased gallbladder function. Consider radionuclide biliary scan for evaluation of gallbladder function. No bowel obstruction or inflammation. Electronically Signed   By: Burman Nieves M.D.   On: 01/16/2023 19:56    Microbiology: Results for orders placed or performed during the hospital encounter of 01/16/23  SARS Coronavirus 2 by RT PCR (hospital order, performed in [facility] hospital lab) *cepheid single result test* Anterior Nasal Swab     Status: Abnormal   Collection Time: 01/16/23  7:05 PM   Specimen: Anterior Nasal Swab  Result Value Ref Range Status   SARS Coronavirus 2 by RT PCR POSITIVE (A) NEGATIVE Final    Comment: (NOTE) SARS-CoV-2 target nucleic acids are DETECTED  SARS-CoV-2 RNA is generally detectable in upper respiratory specimens  during the acute phase of infection.  Positive results are indicative  of the presence of the identified virus, but do not rule out bacterial infection or co-infection with other pathogens not detected by the test.  Clinical correlation with patient history and  other diagnostic information is necessary to  determine patient infection status.  The expected result is negative.  Fact Sheet for Patients:   RoadLapTop.co.za   Fact Sheet for Healthcare Providers:   http://kim-miller.com/    This test is not yet approved or cleared by the [place] FDA and  has been authorized for detection and/or diagnosis of SARS-CoV-2 by FDA under an Emergency Use Authorization (EUA).  This EUA will remain in effect (meaning this test can be used) for the duration of  the COVID-19 declaration under Section 564(b)(1)  of the Act, 21 U.S.C. section 360-bbb-3(b)(1), unless the authorization is terminated or revoked sooner.   Performed at [facility], [address] [place], [place] [address]     Labs: CBC: Recent Labs  Lab 01/16/23 1750 01/17/23 0520  WBC 8.8 7.9  HGB 15.3* 13.0  HCT 43.7 37.7  MCV 83.9 86.3  PLT 335 257   Basic Metabolic Panel: Recent Labs  Lab 01/17/23 0520 01/18/23 1001 01/19/23 0448 01/20/23 0456 01/21/23 0607  NA 134* 140 140 140 140  K 3.5 3.6 3.5 3.4* 3.6  CL 105 114* 113* 110 110  CO2 18* 18* 20* 21* 21*  GLUCOSE 102* 122* 105* 107* 104*  BUN 94* 62* 48* 31* 18  CREATININE 4.12* 2.51* 1.94* 1.59* 1.42*  CALCIUM 8.2* 8.7* 8.7* 8.9 9.0   Liver Function Tests: Recent Labs  Lab 01/16/23 1750 01/17/23 0520  AST 14* 14*  ALT 16 13  ALKPHOS 66 49  BILITOT 0.7 0.9  PROT 8.6* 7.0  ALBUMIN 4.7 3.5  CBG: No results for input(s): "GLUCAP" in the last 168 hours.  Discharge time spent: 38 minutes.  Signed: Marcelino Duster, MD [facility] 01/21/2023

## 2023-01-21 NOTE — Progress Notes (Signed)
Discharge instructions were reviewed with patient and his family. Questions were encourage and answered. Belongings were collected by family and patient.

## 2023-02-22 ENCOUNTER — Inpatient Hospital Stay
Admission: EM | Admit: 2023-02-22 | Discharge: 2023-02-25 | DRG: 683 | Disposition: A | Discharge status: Home / Self Care | Payer: BC Managed Care – PPO | Attending: Internal Medicine | Admitting: Internal Medicine

## 2023-02-22 ENCOUNTER — Other Ambulatory Visit: Payer: Self-pay

## 2023-02-22 ENCOUNTER — Encounter: Payer: Self-pay | Admitting: Emergency Medicine

## 2023-02-22 DIAGNOSIS — Z8616 Personal history of COVID-19: Secondary | ICD-10-CM | POA: Diagnosis not present

## 2023-02-22 DIAGNOSIS — N189 Chronic kidney disease, unspecified: Secondary | ICD-10-CM | POA: Diagnosis present

## 2023-02-22 DIAGNOSIS — Z8249 Family history of ischemic heart disease and other diseases of the circulatory system: Secondary | ICD-10-CM

## 2023-02-22 DIAGNOSIS — I129 Hypertensive chronic kidney disease with stage 1 through stage 4 chronic kidney disease, or unspecified chronic kidney disease: Secondary | ICD-10-CM | POA: Diagnosis present

## 2023-02-22 DIAGNOSIS — Z79899 Other long term (current) drug therapy: Secondary | ICD-10-CM | POA: Diagnosis not present

## 2023-02-22 DIAGNOSIS — Z6841 Body Mass Index (BMI) 40.0 and over, adult: Secondary | ICD-10-CM

## 2023-02-22 DIAGNOSIS — R432 Parageusia: Secondary | ICD-10-CM | POA: Diagnosis present

## 2023-02-22 DIAGNOSIS — E872 Acidosis, unspecified: Secondary | ICD-10-CM | POA: Diagnosis present

## 2023-02-22 DIAGNOSIS — K219 Gastro-esophageal reflux disease without esophagitis: Secondary | ICD-10-CM | POA: Diagnosis present

## 2023-02-22 DIAGNOSIS — N179 Acute kidney failure, unspecified: Secondary | ICD-10-CM | POA: Diagnosis present

## 2023-02-22 DIAGNOSIS — I10 Essential (primary) hypertension: Secondary | ICD-10-CM | POA: Diagnosis present

## 2023-02-22 DIAGNOSIS — J45909 Unspecified asthma, uncomplicated: Secondary | ICD-10-CM | POA: Diagnosis present

## 2023-02-22 DIAGNOSIS — N1831 Chronic kidney disease, stage 3a: Secondary | ICD-10-CM | POA: Diagnosis present

## 2023-02-22 LAB — URINALYSIS, ROUTINE W REFLEX MICROSCOPIC
Bilirubin Urine: NEGATIVE
Glucose, UA: NEGATIVE mg/dL
Hgb urine dipstick: NEGATIVE
Ketones, ur: 5 mg/dL — AB
Leukocytes,Ua: NEGATIVE
Nitrite: NEGATIVE
Protein, ur: NEGATIVE mg/dL
Specific Gravity, Urine: 1.015 (ref 1.005–1.030)
pH: 5 (ref 5.0–8.0)

## 2023-02-22 LAB — COMPREHENSIVE METABOLIC PANEL
ALT: 15 U/L (ref 0–44)
AST: 17 U/L (ref 15–41)
Albumin: 4.8 g/dL (ref 3.5–5.0)
Alkaline Phosphatase: 61 U/L (ref 38–126)
Anion gap: 18 — ABNORMAL HIGH (ref 5–15)
BUN: 79 mg/dL — ABNORMAL HIGH (ref 8–23)
CO2: 17 mmol/L — ABNORMAL LOW (ref 22–32)
Calcium: 10.1 mg/dL (ref 8.9–10.3)
Chloride: 100 mmol/L (ref 98–111)
Creatinine, Ser: 5.44 mg/dL — ABNORMAL HIGH (ref 0.44–1.00)
GFR, Estimated: 8 mL/min — ABNORMAL LOW (ref >60)
Glucose, Bld: 110 mg/dL — ABNORMAL HIGH (ref 70–99)
Potassium: 4.9 mmol/L (ref 3.5–5.1)
Sodium: 135 mmol/L (ref 135–145)
Total Bilirubin: 1.1 mg/dL (ref 0.3–1.2)
Total Protein: 8.9 g/dL — ABNORMAL HIGH (ref 6.5–8.1)

## 2023-02-22 LAB — LACTIC ACID, PLASMA: Lactic Acid, Venous: 1.6 mmol/L (ref 0.5–1.9)

## 2023-02-22 LAB — CBC
HCT: 44.1 % (ref 36.0–46.0)
Hemoglobin: 14.4 g/dL (ref 12.0–15.0)
MCH: 29.1 pg (ref 26.0–34.0)
MCHC: 32.7 g/dL (ref 30.0–36.0)
MCV: 89.1 fL (ref 80.0–100.0)
Platelets: 222 10*3/uL (ref 150–400)
RBC: 4.95 MIL/uL (ref 3.87–5.11)
RDW: 14.8 % (ref 11.5–15.5)
WBC: 6.4 10*3/uL (ref 4.0–10.5)
nRBC: 0 % (ref 0.0–0.2)

## 2023-02-22 MED ORDER — PANTOPRAZOLE SODIUM 40 MG PO TBEC
40.0000 mg | DELAYED_RELEASE_TABLET | Freq: Every day | ORAL | Status: DC
  Administered 2023-02-23 – 2023-02-24 (×3): 40 mg via ORAL
  Filled 2023-02-22 (×3): qty 1

## 2023-02-22 MED ORDER — PANTOPRAZOLE SODIUM 40 MG PO TBEC: 40.0000 mg | DELAYED_RELEASE_TABLET | Freq: Every day | ORAL | Status: DC

## 2023-02-22 MED ORDER — LACTATED RINGERS IV SOLN: INTRAVENOUS | Status: AC

## 2023-02-22 MED ORDER — POLYETHYLENE GLYCOL 3350 17 G PO PACK: 17.0000 g | PACK | Freq: Every day | ORAL | Status: DC | PRN

## 2023-02-22 MED ORDER — ENOXAPARIN SODIUM 30 MG/0.3ML IJ SOSY
30.0000 mg | PREFILLED_SYRINGE | INTRAMUSCULAR | Status: DC
  Administered 2023-02-22 – 2023-02-24 (×3): 30 mg via SUBCUTANEOUS
  Filled 2023-02-22 (×3): qty 0.3

## 2023-02-22 MED ORDER — ACETAMINOPHEN 325 MG PO TABS
650.0000 mg | ORAL_TABLET | Freq: Four times a day (QID) | ORAL | Status: DC | PRN
  Administered 2023-02-24 (×2): 650 mg via ORAL
  Filled 2023-02-22 (×2): qty 2

## 2023-02-22 MED ORDER — ONDANSETRON HCL 4 MG PO TABS
4.0000 mg | ORAL_TABLET | Freq: Four times a day (QID) | ORAL | Status: DC | PRN
  Administered 2023-02-23: 4 mg via ORAL
  Filled 2023-02-22: qty 1

## 2023-02-22 MED ORDER — SODIUM CHLORIDE 0.9% FLUSH
3.0000 mL | Freq: Two times a day (BID) | INTRAVENOUS | Status: DC
  Administered 2023-02-22 – 2023-02-25 (×6): 3 mL via INTRAVENOUS

## 2023-02-22 MED ORDER — LACTATED RINGERS IV BOLUS
1000.0000 mL | Freq: Once | INTRAVENOUS | Status: AC
  Administered 2023-02-22: 1000 mL via INTRAVENOUS

## 2023-02-22 MED ORDER — ACETAMINOPHEN 325 MG RE SUPP: 650.0000 mg | Freq: Four times a day (QID) | RECTAL | Status: DC | PRN

## 2023-02-22 MED ORDER — ONDANSETRON HCL 4 MG/2ML IJ SOLN
4.0000 mg | Freq: Four times a day (QID) | INTRAMUSCULAR | Status: DC | PRN
  Administered 2023-02-23 – 2023-02-25 (×4): 4 mg via INTRAVENOUS
  Filled 2023-02-22 (×4): qty 2

## 2023-02-22 MED ORDER — SODIUM BICARBONATE 650 MG PO TABS
650.0000 mg | ORAL_TABLET | Freq: Two times a day (BID) | ORAL | Status: DC
  Administered 2023-02-22 – 2023-02-25 (×7): 650 mg via ORAL
  Filled 2023-02-22 (×6): qty 1

## 2023-02-22 NOTE — ED Provider Notes (Signed)
[facility] Provider Note    Event Date/Time   First MD Initiated Contact with Patient 02/22/23 1251     (approximate)   History   Abnormal Lab   HPI  Brittney Sullivan is a 62 y.o. female who comes in with concerns for AKI.  I reviewed records patient was admitted back in September 2024 found to have an AKI with a creatinine of 5 in the setting of COVID and has a period that her creatinine improved to 1.42 and it was last checked 1 month ago however on repeat labs today it we had gone back up to 5.4 therefore patient was sent in for evaluation.  Patient reports that since hospital discharge she has not been drinking very much.  She reports that everything she taste taste metallic after she got COVID.  She reports feeling more fatigue and weak.  She denies any chest pain, shortness of breath, falls hitting her head, abdominal pain.  She reports still urinating   Physical Exam   Triage Vital Signs: ED Triage Vitals  Encounter Vitals Group     BP 02/22/23 1018 108/73     Systolic BP Percentile --      Diastolic BP Percentile --      Pulse Rate 02/22/23 1018 84     Resp 02/22/23 1018 18     Temp 02/22/23 1018 98.4 F (36.9 C)     Temp Source 02/22/23 1018 Oral     SpO2 02/22/23 1018 100 %     Weight 02/22/23 1015 213 lb (96.6 kg)     Height 02/22/23 1015 5' (1.524 m)     Head Circumference --      Peak Flow --      Pain Score 02/22/23 1015 0     Pain Loc --      Pain Education --      Exclude from Growth Chart --     Most recent vital signs: Vitals:   02/22/23 1018  BP: 108/73  Pulse: 84  Resp: 18  Temp: 98.4 F (36.9 C)  SpO2: 100%     General: Awake, no distress.  CV:  Good peripheral perfusion.  Resp:  Normal effort.  Abd:  No distention.  Soft and nontender Other:     ED Results / Procedures / Treatments   Labs (all labs ordered are listed, but only abnormal results are displayed) Labs Reviewed  COMPREHENSIVE METABOLIC PANEL -  Abnormal; Notable for the following components:      Result Value   CO2 17 (*)    Glucose, Bld 110 (*)    BUN 79 (*)    Creatinine, Ser 5.44 (*)    Total Protein 8.9 (*)    GFR, Estimated 8 (*)    Anion gap 18 (*)    All other components within normal limits  CBC  URINALYSIS, ROUTINE W REFLEX MICROSCOPIC      PROCEDURES:  Critical Care performed: No  Procedures   MEDICATIONS ORDERED IN ED: Medications  lactated ringers bolus 1,000 mL (has no administration in time range)     IMPRESSION / MDM / ASSESSMENT AND PLAN / ED COURSE  I reviewed the triage vital signs and the nursing notes.   Patient's presentation is most consistent with acute presentation with potential threat to life or bodily function.   Patient comes in with concern for dehydration labs concerning for new AKI.  Suspect this is prerenal in nature given not eating and drinking  due to metallic taste in her mouth post-COVID.  Will get postvoid bladder scan but this seems less likely of a cause such as urinary retention.  Potassium is normal  I reviewed the hospital admission from 9/4  I did discuss possible team for admission gave 1 L of fluid   FINAL CLINICAL IMPRESSION(S) / ED DIAGNOSES   Final diagnoses:  AKI (acute kidney injury) (HCC)     Rx / DC Orders   ED Discharge Orders     None        Note:  This document was prepared using Dragon voice recognition software and may include unintentional dictation errors.   Concha Se, MD 02/22/23 1315

## 2023-02-22 NOTE — Assessment & Plan Note (Signed)
Patient has a history of stage III morbid obesity, increasing patient's complexity and risk of long-term poor prognosis.

## 2023-02-22 NOTE — Assessment & Plan Note (Signed)
-   Hold home lisinopril in the setting of AKI

## 2023-02-22 NOTE — Assessment & Plan Note (Signed)
Likely secondary to significant AKI, however will evaluate lactic acid.  Patient is not currently on any antiglycemic agents to suggest euglycemic DKA.  - Lactic acid pending - Start sodium bicarb tablets

## 2023-02-22 NOTE — ED Triage Notes (Addendum)
Pt via POV from home. Pt was admitted to the hospital in Sept for AKI and has a hx of CKD, states that she went in for lab work yesterday and states that her kidney function was worse. Lab work from yesterday reports BUN 85 and Cre 5.77. Pt is not a dialysis pt. Pt is A&Ox4 and NAD, ambulatory to triage

## 2023-02-22 NOTE — Assessment & Plan Note (Addendum)
Patient was recently admitted in early September for COVID-19 with GI symptoms that led to significant prerenal AKI with creatinine of 5, which subsequently improved down to 1.4 prior to discharge.  Since then, patient has continued to have poor p.o. intake, likely due to dysgeusia associated with COVID-19.  I did counsel her that if dysgeusia improves significantly, we will need to consider that it may have been uremic in nature.  CT of the abdomen on 9/4 did not note any renal abnormality or hydronephrosis.  - S/p 1 L bolus - Continue maintenance fluids for now with encouragement of p.o. intake - Repeat BMP in the a.m. - Strict in and out - If no improvement in creatinine tomorrow, will need nephrology consultation.  At this time, no need for emergent dialysis

## 2023-02-22 NOTE — H&P (Signed)
History and Physical    Patient: Brittney Sullivan [id number]:[id number] DOB: 02/07/1961 DOA: 02/22/2023 DOS: the patient was seen and examined on 02/22/2023 PCP: Jonelle Sidle, PA-C  Patient coming from: Home  Chief Complaint:  Chief Complaint  Patient presents with   Abnormal Lab   HPI: Brittney Sullivan is a 62 y.o. female with medical history significant of CKD stage IIIa, hypertension, morbid obesity, prediabetes, who presents to the ED due to abnormal labs.  Brittney Sullivan states that ever since she contracted COVID in early September, she has been experiencing significant dysgeusia that makes her nauseous and occasionally vomit.  She has not been able to hold much fluid down, stating she drinks less than 16 ounces of any fluid per day. She has lost 33 lbs in the last month per her PCP records. She has not noticed any changes in her urination until the last week, when her urination has decreased in frequency.  She denies any dysuria or hematuria.  She denies any shortness of breath, chest pain, palpitations, abdominal pain.  She does endorse dizziness, especially when showering and significant fatigue after exerting herself.  She states this has been gradually worsening since she was hospitalized.  She denies any shortness of breath or chest pain with exertion though.  ED course: On arrival to the ED, patient was normotensive at 108/73, with heart rate of 84.  She was afebrile at 98.4.  She was saturating at 100% on room air.  Initial workup notable for normal CBC, and CMP with bicarb of 17, BUN of 79, creatinine 5.44, anion gap of 18, and GFR of 8.  Patient started on IV fluids and [facility] contacted for admission.  Review of Systems: As mentioned in the history of present illness. All other systems reviewed and are negative.  Past Medical History:  Diagnosis Date   Allergic rhinitis    Arthritis    GERD (gastroesophageal reflux disease)    Hypertension    Insomnia    Irregular heart rhythm     Migraine    RAD (reactive airway disease)    Vitamin D deficiency    Past Surgical History:  Procedure Laterality Date   BRAIN SURGERY  1962   blood vessel on the outside of the brain   Social History:  reports that she has never smoked. She has never used smokeless tobacco. She reports that she does not drink alcohol and does not use drugs.  Allergies  Allergen Reactions   Alprazolam     Other reaction(s): Headache Difficulty breathing    Family History  Problem Relation Age of Onset   Hypercholesterolemia Mother    Gout Mother    Cancer Father        lung   Hypertension Father    Gout Brother    Hypertension Son    Healthy Son    Ovarian cysts Sister     Prior to Admission medications   Medication Sig Start Date End Date Taking? Authorizing Provider  aspirin-acetaminophen-caffeine (EXCEDRIN MIGRAINE) [phone] MG tablet Take by mouth. 02/24/10   [provider]  feeding supplement (ENSURE ENLIVE / ENSURE PLUS) LIQD Take 237 mLs by mouth 3 (three) times daily between meals. 01/21/23   Marcelino Duster, MD  ondansetron (ZOFRAN-ODT) 4 MG disintegrating tablet Take 1 tablet (4 mg total) by mouth every 8 (eight) hours as needed for nausea or vomiting. 01/21/23   Marcelino Duster, MD  pantoprazole (PROTONIX) 40 MG tablet Take 1 tablet (40 mg total) by mouth  daily. 01/22/23   Marcelino Duster, MD  phentermine 15 MG capsule Take 15 mg by mouth every morning. 12/28/22   [provider]  topiramate (TOPAMAX) 25 MG tablet Take 25 mg by mouth at bedtime. 12/28/22 12/28/23  [provider]    Physical Exam: Vitals:   02/22/23 1015 02/22/23 1018 02/22/23 1400  BP:  108/73 119/60  Pulse:  84 74  Resp:  18   Temp:  98.4 F (36.9 C)   TempSrc:  Oral   SpO2:  100% 100%  Weight: 96.6 kg    Height: 5' (1.524 m)     Physical Exam Vitals and nursing note reviewed.  Constitutional:      General: She is not in acute distress.    Appearance: She  is obese.  HENT:     Head: Normocephalic and atraumatic.     Mouth/Throat:     Comments: Very dry oropharynx Eyes:     Conjunctiva/sclera: Conjunctivae normal.     Pupils: Pupils are equal, round, and reactive to light.  Cardiovascular:     Rate and Rhythm: Normal rate and regular rhythm.     Heart sounds: No murmur heard. Pulmonary:     Effort: Pulmonary effort is normal. No respiratory distress.     Breath sounds: Normal breath sounds. No wheezing, rhonchi or rales.  Abdominal:     General: Bowel sounds are normal. There is no distension.     Palpations: Abdomen is soft.     Tenderness: There is no abdominal tenderness. There is no guarding.  Musculoskeletal:     Right lower leg: No edema.     Left lower leg: No edema.  Skin:    General: Skin is warm and dry.  Neurological:     General: No focal deficit present.     Mental Status: She is alert and oriented to person, place, and time. Mental status is at baseline.  Psychiatric:        Mood and Affect: Mood normal.        Behavior: Behavior normal.    Data Reviewed: CBC with WBC of 6.4, hemoglobin 14.4, platelets of 222 CMP with sodium of 135, potassium 4.9, bicarb 17, glucose 110, BUN 79, creatinine 5.44, anion gap 18, AST 17, ALT 15 and GFR of 8  Results are pending, will review when available.  Assessment and Plan:  * Acute kidney injury superimposed on chronic kidney disease (HCC) Patient was recently admitted in early September for COVID-19 with GI symptoms that led to significant prerenal AKI with creatinine of 5, which subsequently improved down to 1.4 prior to discharge.  Since then, patient has continued to have poor p.o. intake, likely due to dysgeusia associated with COVID-19.  I did counsel her that if dysgeusia improves significantly, we will need to consider that it may have been uremic in nature.  CT of the abdomen on 9/4 did not note any renal abnormality or hydronephrosis.  - S/p 1 L bolus - Continue  maintenance fluids for now with encouragement of p.o. intake - Repeat BMP in the a.m. - Strict in and out - If no improvement in creatinine tomorrow, will need nephrology consultation.  At this time, no need for emergent dialysis  Metabolic acidosis Likely secondary to significant AKI, however will evaluate lactic acid.  Patient is not currently on any antiglycemic agents to suggest euglycemic DKA.  - Lactic acid pending - Start sodium bicarb tablets  Morbid obesity (HCC) Patient has a history of stage III  morbid obesity, increasing patient's complexity and risk of long-term poor prognosis.   Essential hypertension - Hold home lisinopril in the setting of AKI  Advance Care Planning:   Code Status: Full Code   Consults: None  Family Communication: Patient's husband updated at bedside  Severity of Illness: The appropriate patient status for this patient is INPATIENT. Inpatient status is judged to be reasonable and necessary in order to provide the required intensity of service to ensure the patient's safety. The patient's presenting symptoms, physical exam findings, and initial radiographic and laboratory data in the context of their chronic comorbidities is felt to place them at high risk for further clinical deterioration. Furthermore, it is not anticipated that the patient will be medically stable for discharge from the hospital within 2 midnights of admission.   * I certify that at the point of admission it is my clinical judgment that the patient will require inpatient hospital care spanning beyond 2 midnights from the point of admission due to high intensity of service, high risk for further deterioration and high frequency of surveillance required.*  Author: Verdene Lennert, MD 02/22/2023 2:12 PM  For on call review [email or web address].

## 2023-02-23 DIAGNOSIS — N179 Acute kidney failure, unspecified: Secondary | ICD-10-CM | POA: Diagnosis not present

## 2023-02-23 DIAGNOSIS — N189 Chronic kidney disease, unspecified: Secondary | ICD-10-CM | POA: Diagnosis not present

## 2023-02-23 LAB — BASIC METABOLIC PANEL
Anion gap: 14 (ref 5–15)
BUN: 69 mg/dL — ABNORMAL HIGH (ref 8–23)
CO2: 20 mmol/L — ABNORMAL LOW (ref 22–32)
Calcium: 9.3 mg/dL (ref 8.9–10.3)
Chloride: 104 mmol/L (ref 98–111)
Creatinine, Ser: 4.04 mg/dL — ABNORMAL HIGH (ref 0.44–1.00)
GFR, Estimated: 12 mL/min — ABNORMAL LOW (ref >60)
Glucose, Bld: 95 mg/dL (ref 70–99)
Potassium: 4.2 mmol/L (ref 3.5–5.1)
Sodium: 138 mmol/L (ref 135–145)

## 2023-02-23 LAB — MAGNESIUM: Magnesium: 2.3 mg/dL (ref 1.7–2.4)

## 2023-02-23 LAB — PHOSPHORUS: Phosphorus: 4.2 mg/dL (ref 2.5–4.6)

## 2023-02-23 MED ORDER — LACTATED RINGERS IV SOLN: INTRAVENOUS | Status: DC

## 2023-02-23 MED ORDER — SUCRALFATE 1 G PO TABS
1.0000 g | ORAL_TABLET | Freq: Three times a day (TID) | ORAL | Status: DC
  Administered 2023-02-23 – 2023-02-25 (×8): 1 g via ORAL
  Filled 2023-02-23 (×8): qty 1

## 2023-02-23 NOTE — Progress Notes (Signed)
Mobility Specialist - Progress Note   02/23/23 1054  Mobility  Activity Ambulated independently in hallway  Level of Assistance Independent  Assistive Device None  Distance Ambulated (ft) 100 ft  Activity Response Tolerated well  Mobility Referral Yes  $Mobility charge 1 Mobility  Mobility Specialist Start Time (ACUTE ONLY) 0951  Mobility Specialist Stop Time (ACUTE ONLY) 1008  Mobility Specialist Time Calculation (min) (ACUTE ONLY) 17 min   Pt supine in bed on RA upon arrival. Pt STS and ambulates in hallway indep with no LOB noted. Pt returns to bed with needs in reach.   Terrilyn Saver  Mobility Specialist  02/23/23 10:55 AM

## 2023-02-23 NOTE — Hospital Course (Signed)
62yo with h/o stage 3a CKD, HTN, prediabetes, and morbid obesity who presented on 10/11 with poor PO intake since having COVID-19 in early September, resulting in 33 lb weight loss.  She was found to have AKI with creatinine 5.44, up from 1.42 on 9/9.

## 2023-02-23 NOTE — TOC CM/SW Note (Signed)
Transition of Care [facility]) - Inpatient Brief Assessment   Patient Details  Name: Brittney Sullivan MRN: [id number] Date of Birth: [birth year]  Transition of [facility]) CM/SW Contact:    Kemper Durie, RN Phone Number: 02/23/2023, 4:24 PM   Clinical Narrative:  Brief assessment done, no needs identified at this time  Transition of Care Asessment: Insurance and Status: Insurance coverage has been reviewed Patient has primary care physician: Yes Home environment has been reviewed: Yes Prior level of function:: Independent Prior/Current Home Services: No current home services Social Determinants of Health Reivew: SDOH reviewed no interventions necessary Readmission risk has been reviewed: Yes Transition of care needs: no transition of care needs at this time

## 2023-02-23 NOTE — Progress Notes (Signed)
Progress Note   Patient: Brittney Sullivan [id number]:[id number] DOB: 04-12-1961 DOA: 02/22/2023     1 DOS: the patient was seen and examined on 02/23/2023   Brief hospital course: 62yo with h/o stage 3a CKD, HTN, prediabetes, and morbid obesity who presented on 10/11 with poor PO intake since having COVID-19 in early September, resulting in 33 lb weight loss.  She was found to have AKI with creatinine 5.44, up from 1.42 on 9/9.   Assessment and Plan:  Acute kidney injury superimposed on chronic kidney disease (HCC) Patient was recently admitted in early September for COVID-19 with GI symptoms that led to significant prerenal AKI with creatinine of 5, which subsequently improved down to 1.4 prior to discharge Since then, patient has continued to have poor p.o. intake, likely due to dysgeusia associated with COVID-19 CT of the abdomen on 9/4 did not note any renal abnormality or hydronephrosis Given IVF 1L bolus and MIVF with subsequent improvement  Continue maintenance fluids for now with encouragement of p.o. intake Repeat BMP in the a.m. If no improvement in creatinine tomorrow, will need nephrology consultation.  At this time, no need for emergent dialysis Avoid nephrotoxic agents   Metabolic acidosis Likely secondary to significant AKI, improving Started on sodium bicarb tablets, may be able to stop tomorrow  Dysgeusia/anorexia She has had ongoing symptoms since her COVID-19 infection 5 weeks ago Unlikely uremia since symptoms persisted even after renal function improved  Topiramate is also known to cause these issues and will be discontinued Phentermine is also a possible cause and should be held and possibly discontinued Started on Protonix and Zofran yesterday, will add Carafate Bland diet If not improving, consider GI consultation for possible EGD vs. Gastric emptying study   Morbid obesity (HCC) Patient has a history of stage III morbid obesity, increasing patient's complexity and  risk of long-term poor prognosis Body mass index is 41.6 kg/m.Marland Kitchen  Weight loss should be encouraged Outpatient PCP/bariatric medicine/bariatric surgery f/u encouraged  She reports >30 pound weight loss with current illness   Essential hypertension Hold home lisinopril in the setting of AKI    Consultants: None  Procedures: None  Antibiotics: None  30 Day Unplanned Readmission Risk Score    Flowsheet Row ED to Hosp-Admission (Current) from [facility]22/2023 in [facility]  30 Day Unplanned Readmission Risk Score (%) 16.23 Filed at 02/23/2023 0800       This score is the patient's risk of an unplanned readmission within 30 days of being discharged (0 -100%). The score is based on dignosis, age, lab data, medications, orders, and past utilization.   Low:  0-14.9   Medium: 15-21.9   High: 22-29.9   Extreme: 30 and above           Subjective: She reports that she feels better after IVF.  However, she continues to have a metallic taste in her mouth, dysgeusia and general anorexia.     Objective: Vitals:   02/23/23 0436 02/23/23 0817  BP: (!) 97/48 (!) 111/55  Pulse: 70 68  Resp: 16 18  Temp: 98 F (36.7 C) 99.8 F (37.7 C)  SpO2: 99% 97%    Intake/Output Summary (Last 24 hours) at 02/23/2023 1358 Last data filed at 02/23/2023 1033 Gross per 24 hour  Intake 353.1 ml  Output --  Net 353.1 ml   Filed Weights   02/22/23 1015  Weight: 96.6 kg    Exam:  General:  Appears calm and comfortable and is in  NAD Eyes:   EOMI, normal lids, iris ENT:  grossly normal hearing, lips & tongue, mmm Neck:  no LAD, masses or thyromegaly Cardiovascular:  RRR, no m/r/g. No LE edema.  Respiratory:   CTA bilaterally with no wheezes/rales/rhonchi.  Normal respiratory effort. Abdomen:  soft, NT, ND Skin:  no rash or induration seen on limited exam Musculoskeletal:  grossly normal tone BUE/BLE, good ROM, no bony abnormality Psychiatric:  grossly  normal mood and affect, speech fluent and appropriate, AOx3 Neurologic:  CN 2-12 grossly intact, moves all extremities in coordinated fashion   Data Reviewed: I have reviewed the patient's lab results since admission.  Pertinent labs for today include:  CO2 20, up from 17 BUN 95/Creatinine 4.04/GFR 12, improved from 79/5.44/8 on 10/11 COVID positive on 9/4    Family Communication: None present; she declined to have me call her husband  Disposition: Status is: Inpatient Remains inpatient appropriate because: improving but still prevalent AKI     Time spent: 50 minutes  Unresulted Labs (From admission, onward)     Start     Ordered   02/24/23 0500  CBC with Differential/Platelet  Tomorrow morning,   R        02/23/23 1358   02/24/23 0500  Basic metabolic panel  Tomorrow morning,   R        02/23/23 1358             Author: Jonah Blue, MD 02/23/2023 1:58 PM  For on call review [email or web address].

## 2023-02-24 DIAGNOSIS — N179 Acute kidney failure, unspecified: Secondary | ICD-10-CM | POA: Diagnosis not present

## 2023-02-24 DIAGNOSIS — N189 Chronic kidney disease, unspecified: Secondary | ICD-10-CM | POA: Diagnosis not present

## 2023-02-24 LAB — BASIC METABOLIC PANEL
Anion gap: 13 (ref 5–15)
BUN: 52 mg/dL — ABNORMAL HIGH (ref 8–23)
CO2: 21 mmol/L — ABNORMAL LOW (ref 22–32)
Calcium: 9.1 mg/dL (ref 8.9–10.3)
Chloride: 105 mmol/L (ref 98–111)
Creatinine, Ser: 3.29 mg/dL — ABNORMAL HIGH (ref 0.44–1.00)
GFR, Estimated: 15 mL/min — ABNORMAL LOW (ref >60)
Glucose, Bld: 96 mg/dL (ref 70–99)
Potassium: 3.9 mmol/L (ref 3.5–5.1)
Sodium: 139 mmol/L (ref 135–145)

## 2023-02-24 LAB — CBC WITH DIFFERENTIAL/PLATELET
Abs Immature Granulocytes: 0 10*3/uL (ref 0.00–0.07)
Basophils Absolute: 0 10*3/uL (ref 0.0–0.1)
Basophils Relative: 0 %
Eosinophils Absolute: 0.1 10*3/uL (ref 0.0–0.5)
Eosinophils Relative: 2 %
HCT: 35.2 % — ABNORMAL LOW (ref 36.0–46.0)
Hemoglobin: 11.9 g/dL — ABNORMAL LOW (ref 12.0–15.0)
Immature Granulocytes: 0 %
Lymphocytes Relative: 46 %
Lymphs Abs: 2 10*3/uL (ref 0.7–4.0)
MCH: 29.2 pg (ref 26.0–34.0)
MCHC: 33.8 g/dL (ref 30.0–36.0)
MCV: 86.5 fL (ref 80.0–100.0)
Monocytes Absolute: 0.5 10*3/uL (ref 0.1–1.0)
Monocytes Relative: 10 %
Neutro Abs: 1.9 10*3/uL (ref 1.7–7.7)
Neutrophils Relative %: 42 %
Platelets: 155 10*3/uL (ref 150–400)
RBC: 4.07 MIL/uL (ref 3.87–5.11)
RDW: 14.4 % (ref 11.5–15.5)
WBC: 4.5 10*3/uL (ref 4.0–10.5)
nRBC: 0 % (ref 0.0–0.2)

## 2023-02-24 MED ORDER — PANTOPRAZOLE SODIUM 40 MG PO TBEC
40.0000 mg | DELAYED_RELEASE_TABLET | Freq: Two times a day (BID) | ORAL | Status: DC
  Administered 2023-02-24 – 2023-02-25 (×2): 40 mg via ORAL
  Filled 2023-02-24 (×2): qty 1

## 2023-02-24 NOTE — Progress Notes (Signed)
Progress Note   Patient: Brittney Sullivan [id number] DOB: January 01, 1961 DOA: 02/22/2023     2 DOS: the patient was seen and examined on 02/24/2023   Brief hospital course: 62yo with h/o stage 3a CKD, HTN, prediabetes, and morbid obesity who presented on 10/11 with poor PO intake since having COVID-19 in early September, resulting in 33 lb weight loss.  She was found to have AKI with creatinine 5.44, up from 1.42 on 9/9.  Improving with IVF.  Stopping IVF and watching without.  Nutrition consult with calorie count ordered.   Assessment and Plan:    Acute kidney injury superimposed on chronic kidney disease (HCC) Patient was recently admitted in early September for COVID-19 with GI symptoms that led to significant prerenal AKI with creatinine of 5, which subsequently improved down to 1.4 prior to discharge Since then, patient has continued to have poor p.o. intake, likely due to dysgeusia associated with COVID-19 CT of the abdomen on 9/4 did not note any renal abnormality or hydronephrosis Given IVF 1L bolus and MIVF with subsequent and ongoing improvement  Continue hold IV fluids for now with encouragement of p.o. intake Repeat BMP in the a.m. Avoid nephrotoxic agents   Metabolic acidosis Likely secondary to significant AKI, improving Started on sodium bicarb tablets, may be able to stop tomorrow   Dysgeusia/anorexia She has had ongoing symptoms since her COVID-19 infection 5 weeks ago Unlikely uremia since symptoms persisted even after renal function improved  Topiramate is also known to cause these issues and will be discontinued Phentermine is also a possible cause and will also be discontinued Started on Protonix and Zofran, added Carafate, will increase Protonix to BID [place] Nutrition consult with calorie count If not improving, consider GI consultation for possible EGD vs. Gastric emptying study   Morbid obesity (HCC) Patient has a history of stage III morbid obesity,  increasing patient's complexity and risk of long-term poor prognosis Body mass index is 41.6 kg/m.Marland Kitchen  Weight loss should be encouraged Outpatient PCP/bariatric medicine/bariatric surgery f/u encouraged  She reports >30 pound weight loss with current illness   Essential hypertension Hold home lisinopril in the setting of AKI       Consultants: None   Procedures: None   Antibiotics: None  30 Day Unplanned Readmission Risk Score    Flowsheet Row ED to Hosp-Admission (Current) from [facility]22/2023 in [facility]  30 Day Unplanned Readmission Risk Score (%) 16.78 Filed at 02/24/2023 0401       This score is the patient's risk of an unplanned readmission within 30 days of being discharged (0 -100%). The score is based on dignosis, age, lab data, medications, orders, and past utilization.   Low:  0-14.9   Medium: 15-21.9   High: 22-29.9   Extreme: 30 and above           Subjective: Somewhat cyclical discussion this AM.  Discussed that she should probably not restart topamax or phentermine in the future.  She described not eating or drinking much at baseline and then taking medications to trying to lose weight and then not being able to eat/drink.  She has been to outpatient nutrition in the past but would be open to this and calorie count today.   Objective: Vitals:   02/24/23 0432 02/24/23 0837  BP: 103/66 (!) 126/57  Pulse: 79 66  Resp: 20 16  Temp: 97.9 F (36.6 C) 98.4 F (36.9 C)  SpO2: 95% 100%    Intake/Output Summary (  Last 24 hours) at 02/24/2023 1307 Last data filed at 02/24/2023 1029 Gross per 24 hour  Intake 2636.67 ml  Output 1900 ml  Net 736.67 ml   Filed Weights   02/22/23 1015  Weight: 96.6 kg    Exam:  General:  Appears calm and comfortable and is in NAD Eyes:   EOMI, normal lids, iris ENT:  grossly normal hearing, lips & tongue, mmm Neck:  no LAD, masses or thyromegaly Cardiovascular:  RRR, no m/r/g. No LE  edema.  Respiratory:   CTA bilaterally with no wheezes/rales/rhonchi.  Normal respiratory effort. Abdomen:  soft, NT, ND Skin:  no rash or induration seen on limited exam Musculoskeletal:  grossly normal tone BUE/BLE, good ROM, no bony abnormality Psychiatric:  grossly normal mood and affect, speech fluent and appropriate, AOx3 Neurologic:  CN 2-12 grossly intact, moves all extremities in coordinated fashion  Data Reviewed: I have reviewed the patient's lab results since admission.  Pertinent labs for today include:  CO2 21 BUN 52/Creatinine 3.29/GFR 15 WBC 4.5 Hgb 11.9    Family Communication: Family members were present throughout evaluation  Disposition: Status is: Inpatient Remains inpatient appropriate because: improving renal function but still altered     Time spent: 50 minutes  Unresulted Labs (From admission, onward)    None        Author: Jonah Blue, MD 02/24/2023 1:07 PM  For on call review [email or web address].

## 2023-02-24 NOTE — Progress Notes (Signed)
Mobility Specialist - Progress Note   02/24/23 1048  Mobility  Activity Ambulated independently in hallway;Stood at bedside;Dangled on edge of bed  Level of Assistance Independent  Assistive Device Other (Comment) (Iv [place])  Distance Ambulated (ft) 180 ft  Activity Response Tolerated well  Mobility Referral Yes  $Mobility charge 1 Mobility  Mobility Specialist Start Time (ACUTE ONLY) 1026  Mobility Specialist Stop Time (ACUTE ONLY) 1041  Mobility Specialist Time Calculation (min) (ACUTE ONLY) 15 min   Pt supine in bed on RA upon arrival. PT completes bed mobility, STS, and ambulates in hallway indep. Pt takes one standing rest break midway thru ambulation. Pt returns to bed with needs in reach and family present.   Terrilyn Saver  Mobility Specialist  02/24/23 10:50 AM

## 2023-02-25 DIAGNOSIS — N179 Acute kidney failure, unspecified: Secondary | ICD-10-CM | POA: Diagnosis not present

## 2023-02-25 DIAGNOSIS — N189 Chronic kidney disease, unspecified: Secondary | ICD-10-CM | POA: Diagnosis not present

## 2023-02-25 LAB — BASIC METABOLIC PANEL
Anion gap: 13 (ref 5–15)
BUN: 42 mg/dL — ABNORMAL HIGH (ref 8–23)
CO2: 23 mmol/L (ref 22–32)
Calcium: 9.6 mg/dL (ref 8.9–10.3)
Chloride: 103 mmol/L (ref 98–111)
Creatinine, Ser: 3.08 mg/dL — ABNORMAL HIGH (ref 0.44–1.00)
GFR, Estimated: 17 mL/min — ABNORMAL LOW (ref >60)
Glucose, Bld: 92 mg/dL (ref 70–99)
Potassium: 4 mmol/L (ref 3.5–5.1)
Sodium: 139 mmol/L (ref 135–145)

## 2023-02-25 LAB — CBC
HCT: 38.6 % (ref 36.0–46.0)
Hemoglobin: 13.2 g/dL (ref 12.0–15.0)
MCH: 29.7 pg (ref 26.0–34.0)
MCHC: 34.2 g/dL (ref 30.0–36.0)
MCV: 86.9 fL (ref 80.0–100.0)
Platelets: 177 10*3/uL (ref 150–400)
RBC: 4.44 MIL/uL (ref 3.87–5.11)
RDW: 14.2 % (ref 11.5–15.5)
WBC: 6.4 10*3/uL (ref 4.0–10.5)
nRBC: 0 % (ref 0.0–0.2)

## 2023-02-25 MED ORDER — SUCRALFATE 1 G PO TABS: 1.0000 g | ORAL_TABLET | Freq: Three times a day (TID) | ORAL | 0 refills | Status: DC

## 2023-02-25 MED ORDER — PANTOPRAZOLE SODIUM 40 MG PO TBEC: 40.0000 mg | DELAYED_RELEASE_TABLET | Freq: Two times a day (BID) | ORAL | 1 refills | Status: DC

## 2023-02-25 NOTE — Progress Notes (Signed)
Mobility Specialist - Progress Note   02/25/23 0816  Mobility  Activity Ambulated with assistance in hallway;Stood at bedside;Dangled on edge of bed  Level of Assistance Standby assist, set-up cues, supervision of patient - no hands on  Assistive Device None  Distance Ambulated (ft) 160 ft  Activity Response Tolerated well  Mobility Referral Yes  $Mobility charge 1 Mobility  Mobility Specialist Start Time (ACUTE ONLY) 0801  Mobility Specialist Stop Time (ACUTE ONLY) [id number]  Mobility Specialist Time Calculation (min) (ACUTE ONLY) 13 min   Pt supine in bed on RA upon arrival. Pt STS and ambulates 1 lap around ns SBA-indep. Pt returns to EOB with needs in reach.   Terrilyn Saver  Mobility Specialist  02/25/23 8:17 AM

## 2023-02-25 NOTE — Plan of Care (Signed)

## 2023-02-25 NOTE — Discharge Summary (Signed)
Physician Discharge Summary   Patient: Brittney Sullivan MRN: [id number] DOB: 1960-06-06  Admit date:     02/22/2023  Discharge date: 02/25/23  Discharge Physician: Jonah Blue   PCP: Jonelle Sidle, PA-C   Recommendations at discharge:   Continue to hold lisinopril until further direction from PCP Continue to work hard to maintain adequate hydration with food/drink Stop taking both topiramate (Topamax) and phentermine Follow up with PA Jones in 1-2 weeks for repeat blood work, recheck blood pressure PCP is encouraged to refer for outpatient nephrology consultation   Discharge Diagnoses: Principal Problem:   Acute kidney injury superimposed on chronic kidney disease (HCC) Active Problems:   Metabolic acidosis   Essential hypertension   Morbid obesity [facility])    Hospital Course: 62yo with h/o stage 3a CKD, HTN, prediabetes, and morbid obesity who presented on 10/11 with poor PO intake since having COVID-19 in early September, resulting in 33 lb weight loss.  She was found to have AKI with creatinine 5.44, up from 1.42 on 9/9.  Improving with IVF.  Stopping IVF and watching without.  Nutrition consult with calorie count ordered.   Assessment and Plan:  Acute kidney injury superimposed on chronic kidney disease (HCC) Patient was recently admitted in early September for COVID-19 with GI symptoms that led to significant prerenal AKI with creatinine of 5, which subsequently improved down to 1.4 prior to discharge Since then, patient has continued to have poor p.o. intake, likely due to dysgeusia associated with COVID-19 CT of the abdomen on 9/4 did not note any renal abnormality or hydronephrosis Given IVF 1L bolus and MIVF with subsequent and ongoing improvement  Renal function is still continuing to improve without IVF and with encouragement of p.o. intake Avoid nephrotoxic agents It is reasonable to dc to home at this time Outpatient nephrology consult encouraged   Metabolic  acidosis Likely secondary to significant AKI, improving Started on sodium bicarb tablets with improvement, will not continue at the time of discharge  Dysgeusia/anorexia She has had ongoing symptoms since her COVID-19 infection 5 weeks ago Unlikely uremia since symptoms persisted even after renal function improved  Topiramate is also known to cause these issues and will be discontinued Phentermine is also a possible cause and will also be discontinued Started on Protonix and Zofran, added Carafate, will increase Protonix to BID [place] [facility] consulted If not improving, consider GI consultation for possible EGD vs. Gastric emptying study   Morbid obesity (HCC) Patient has a history of stage III morbid obesity, increasing patient's complexity and risk of long-term poor prognosis Body mass index is 41.6 kg/m.Marland Kitchen  Weight loss should be encouraged Outpatient PCP/bariatric medicine/bariatric surgery f/u encouraged  She reports >30 pound weight loss with current illness   Essential hypertension Continue to hold home lisinopril in the setting of AKI BP is stable without this medication, will need outpatient f/u       Consultants: Nutrition   Procedures: None   Antibiotics: None   30 Day Unplanned Readmission Risk Score    Flowsheet Row ED to Hosp-Admission (Current) from [facility]/22/2023 in [facility]  30 Day Unplanned Readmission Risk Score (%) 16.93 Filed at 02/25/2023 1200       This score is the patient's risk of an unplanned readmission within 30 days of being discharged (0 -100%). The score is based on dignosis, age, lab data, medications, orders, and past utilization.   Low:  0-14.9   Medium: 15-21.9   High: 22-29.9  Extreme: 30 and above          Disposition: Home Diet recommendation:  Regular diet DISCHARGE MEDICATION: Allergies as of 02/25/2023       Reactions   Alprazolam    Other reaction(s): Headache Difficulty  breathing        Medication List     STOP taking these medications    Excedrin Migraine 250-250-65 MG tablet Generic drug: aspirin-acetaminophen-caffeine   feeding supplement Liqd   lisinopril 2.5 MG tablet Commonly known as: ZESTRIL   ondansetron 4 MG disintegrating tablet Commonly known as: ZOFRAN-ODT   phentermine 15 MG capsule   topiramate 25 MG tablet Commonly known as: TOPAMAX       TAKE these medications    famotidine 20 MG tablet Commonly known as: PEPCID Take 20 mg by mouth 2 (two) times daily.   ondansetron 4 MG tablet Commonly known as: ZOFRAN Take 4 mg by mouth every 8 (eight) hours as needed.   pantoprazole 40 MG tablet Commonly known as: PROTONIX Take 1 tablet (40 mg total) by mouth 2 (two) times daily. What changed: when to take this   sucralfate 1 g tablet Commonly known as: CARAFATE Take 1 tablet (1 g total) by mouth 4 (four) times daily -  with meals and at bedtime.        Discharge Exam:   Subjective: Feeling better, would like to go home.  Still reports that she is unable to take in adequate PO intake - but renal function is continuing to improve without IVF.   Objective: Vitals:   02/24/23 1950 02/25/23 0909  BP: (!) 120/58 129/64  Pulse: 67 65  Resp: 18   Temp: 97.6 F (36.4 C) 97.6 F (36.4 C)  SpO2: 99% 98%    Intake/Output Summary (Last 24 hours) at 02/25/2023 1443 Last data filed at 02/25/2023 1426 Gross per 24 hour  Intake 240 ml  Output --  Net 240 ml   Filed Weights   02/22/23 1015  Weight: 96.6 kg    Exam:  General:  Appears calm and comfortable and is in NAD Eyes:   EOMI, normal lids, iris ENT:  grossly normal hearing, lips & tongue, mmm Neck:  no LAD, masses or thyromegaly Cardiovascular:  RRR, no m/r/g. No LE edema.  Respiratory:   CTA bilaterally with no wheezes/rales/rhonchi.  Normal respiratory effort. Abdomen:  soft, NT, ND Skin:  no rash or induration seen on limited exam Musculoskeletal:   grossly normal tone BUE/BLE, good ROM, no bony abnormality Psychiatric:  grossly normal mood and affect, speech fluent and appropriate, AOx3 Neurologic:  CN 2-12 grossly intact, moves all extremities in coordinated fashion  Data Reviewed: I have reviewed the patient's lab results since admission.  Pertinent labs for today include:  BUN 42/Creatinine 3.08/GFR 17, improving Normal CBC     Condition at discharge: improving  The results of significant diagnostics from this hospitalization (including imaging, microbiology, ancillary and laboratory) are listed below for reference.   Imaging Studies: No results found.  Microbiology: Results for orders placed or performed during the hospital encounter of 01/16/23  SARS Coronavirus 2 by RT PCR (hospital order, performed in [facility] hospital lab) *cepheid single result test* Anterior Nasal Swab     Status: Abnormal   Collection Time: 01/16/23  7:05 PM   Specimen: Anterior Nasal Swab  Result Value Ref Range Status   SARS Coronavirus 2 by RT PCR POSITIVE (A) NEGATIVE Final    Comment: (NOTE) SARS-CoV-2 target nucleic acids are  DETECTED  SARS-CoV-2 RNA is generally detectable in upper respiratory specimens  during the acute phase of infection.  Positive results are indicative  of the presence of the identified virus, but do not rule out bacterial infection or co-infection with other pathogens not detected by the test.  Clinical correlation with patient history and  other diagnostic information is necessary to determine patient infection status.  The expected result is negative.  Fact Sheet for Patients:   RoadLapTop.co.za   Fact Sheet for Healthcare Providers:   http://kim-miller.com/    This test is not yet approved or cleared by the [place] FDA and  has been authorized for detection and/or diagnosis of SARS-CoV-2 by FDA under an Emergency Use Authorization (EUA).  This EUA  will remain in effect (meaning this test can be used) for the duration of  the COVID-19 declaration under Section 564(b)(1)  of the Act, 21 U.S.C. section 360-bbb-3(b)(1), unless the authorization is terminated or revoked sooner.   Performed at [facility], [address] [place], [place] [address]     Labs: CBC: Recent Labs  Lab 02/22/23 1020 02/24/23 0459 02/25/23 0845  WBC 6.4 4.5 6.4  NEUTROABS  --  1.9  --   HGB 14.4 11.9* 13.2  HCT 44.1 35.2* 38.6  MCV 89.1 86.5 86.9  PLT 222 155 177   Basic Metabolic Panel: Recent Labs  Lab 02/22/23 1020 02/23/23 0547 02/24/23 0459 02/25/23 0845  NA 135 138 139 139  K 4.9 4.2 3.9 4.0  CL 100 104 105 103  CO2 17* 20* 21* 23  GLUCOSE 110* 95 96 92  BUN 79* 69* 52* 42*  CREATININE 5.44* 4.04* 3.29* 3.08*  CALCIUM 10.1 9.3 9.1 9.6  MG  --  2.3  --   --   PHOS  --  4.2  --   --    Liver Function Tests: Recent Labs  Lab 02/22/23 1020  AST 17  ALT 15  ALKPHOS 61  BILITOT 1.1  PROT 8.9*  ALBUMIN 4.8   CBG: No results for input(s): "GLUCAP" in the last 168 hours.  Discharge time spent: greater than 30 minutes.  Signed: Jonah Blue, MD [facility] 02/25/2023

## 2023-02-25 NOTE — Progress Notes (Addendum)
Initial Nutrition Assessment  DOCUMENTATION CODES:   Morbid obesity  INTERVENTION:   If pt does not discharge, recommend:  Vital Cuisine TID with meals, each supplement provides 520kcal and 22g of protein.   Magic cup TID with meals, each supplement provides 290 kcal and 9 grams of protein  Ensure Enlive po TID, each supplement provides 350 kcal and 20 grams of protein.  MVI po daily   Pt at high refeed risk  Diet education   NUTRITION DIAGNOSIS:   Inadequate oral intake related to acute illness as evidenced by per patient/family report.  GOAL:   Patient will meet greater than or equal to 90% of their needs  MONITOR:   PO intake, Supplement acceptance, Labs, Weight trends, I & O's, Skin  REASON FOR ASSESSMENT:   Consult Assessment of nutrition requirement/status  ASSESSMENT:   62 y/o female with h/o CKD, HTN, GERD, morbid obesity and recent COVID 19 who is admitted with AKI, metabolic acidosis and dysgeusia/anorexia.  Met with pt and family in room today. Pt reports poor appetite and oral intake since having COVID ~ 5 weeks ago. Pt reports an ongoing metallic taste in her mouth along with chronic nausea. RD unsure if pt is uremic or not. Pt reports that she has lost ~30lbs over the past few months and reports that her hair is falling out. Per chart, pt appears to be down ~24lbs(10%) since August; this is significant weight loss. Pt reports that she was initiated on topiramate (Topamax) and phentermine in August to help her loose weight; pt stopped taking these medications last week. Pt reports continued poor appetite and oral intake in hospital. Pt ate a few bites of watermelon today. RD discussed with pt the importance of adequate nutrition needed to preserve lean muscle and preserve gut function. Pt reports that she has tried supplements but reports that she was unable to drink them. RD provided diet education to help pt find some ways to incorporate more protein into her  diet. Pt reports that she likes things cold; recommended for pt to try putting supplements in the freezer to get them really cold. Pt likes chocolate, banana and butter pecan flavors. Pt is willing to try different supplements in the hospital but pt is scheduled to discharge home today. RD explained to pt that if she discharges home and continues to not to eat, she will be at high risk for another AKI and possibly dialysis. Pt understands that she needs to eat and she is willing to try some different supplements at home. Pt is at high refeed risk. RD did discuss with pt that is her oral intake continues to be poor, next steps would be admitting her for an NGT and nutrition support; pt would like to avoid this if possible.   Medications reviewed and include: lovenox, protonix, Na bicarbonate, carafate  Labs reviewed: K 4.0 wnl, BUN 42(H), creat 3.08(H) P 4.2 wnl, Mg 2.3 wnl- 10/12  NUTRITION - FOCUSED PHYSICAL EXAM:  Flowsheet Row Most Recent Value  Orbital Region No depletion  Upper Arm Region No depletion  Thoracic and Lumbar Region No depletion  Buccal Region No depletion  Temple Region No depletion  Clavicle Bone Region Mild depletion  Clavicle and Acromion Bone Region Mild depletion  Scapular Bone Region No depletion  Dorsal Hand Mild depletion  Patellar Region No depletion  Anterior Thigh Region No depletion  Posterior Calf Region No depletion  Edema (RD Assessment) None  Hair Reviewed  Eyes Reviewed  Mouth Reviewed  Skin Reviewed  Nails Reviewed   Diet Order:   Diet Order             Diet general           DIET SOFT Room service appropriate? Yes; Fluid consistency: Thin  Diet effective now                  EDUCATION NEEDS:   Education needs have been addressed  Skin:  Skin Assessment: Reviewed RN Assessment  Last BM:  pta  Height:   Ht Readings from Last 1 Encounters:  02/22/23 5' (1.524 m)    Weight:   Wt Readings from Last 1 Encounters:  02/25/23  99.9 kg    Ideal Body Weight:  45.4 kg  BMI:  Body mass index is 43.02 kg/m.  Estimated Nutritional Needs:   Kcal:  1800-2100kcal/day  Protein:  90-105g/day  Fluid:  1.4-1.6L/day  Betsey Holiday MS, RD, LDN Please refer to [facility] for RD and/or RD on-call/weekend/after hours pager

## 2023-11-28 LAB — COLOGUARD: COLOGUARD: NEGATIVE

## 2024-03-11 ENCOUNTER — Encounter: Payer: Self-pay | Admitting: Ophthalmology

## 2024-03-12 NOTE — Discharge Instructions (Signed)

## 2024-03-17 ENCOUNTER — Other Ambulatory Visit: Payer: Self-pay

## 2024-03-17 ENCOUNTER — Encounter
Admission: RE | Disposition: A | Discharge status: Home / Self Care | Payer: Self-pay | Source: Home / Self Care | Attending: Ophthalmology

## 2024-03-17 ENCOUNTER — Ambulatory Visit: Payer: Self-pay | Admitting: Anesthesiology

## 2024-03-17 ENCOUNTER — Encounter: Payer: Self-pay | Admitting: Ophthalmology

## 2024-03-17 ENCOUNTER — Ambulatory Visit
Admission: RE | Admit: 2024-03-17 | Discharge: 2024-03-17 | Disposition: A | Discharge status: Home / Self Care | Attending: Ophthalmology | Admitting: Ophthalmology

## 2024-03-17 DIAGNOSIS — Z6841 Body Mass Index (BMI) 40.0 and over, adult: Secondary | ICD-10-CM | POA: Insufficient documentation

## 2024-03-17 DIAGNOSIS — H2512 Age-related nuclear cataract, left eye: Secondary | ICD-10-CM | POA: Insufficient documentation

## 2024-03-17 DIAGNOSIS — R7303 Prediabetes: Secondary | ICD-10-CM | POA: Insufficient documentation

## 2024-03-17 DIAGNOSIS — E66813 Obesity, class 3: Secondary | ICD-10-CM | POA: Diagnosis not present

## 2024-03-17 DIAGNOSIS — Z8249 Family history of ischemic heart disease and other diseases of the circulatory system: Secondary | ICD-10-CM | POA: Diagnosis not present

## 2024-03-17 DIAGNOSIS — M1990 Unspecified osteoarthritis, unspecified site: Secondary | ICD-10-CM | POA: Diagnosis not present

## 2024-03-17 DIAGNOSIS — I129 Hypertensive chronic kidney disease with stage 1 through stage 4 chronic kidney disease, or unspecified chronic kidney disease: Secondary | ICD-10-CM | POA: Insufficient documentation

## 2024-03-17 DIAGNOSIS — K219 Gastro-esophageal reflux disease without esophagitis: Secondary | ICD-10-CM | POA: Diagnosis not present

## 2024-03-17 DIAGNOSIS — N1831 Chronic kidney disease, stage 3a: Secondary | ICD-10-CM | POA: Diagnosis not present

## 2024-03-17 HISTORY — DX: Chronic kidney disease, stage 3a: N18.31

## 2024-03-17 HISTORY — DX: Body Mass Index (BMI) 40.0 and over, adult: Z684

## 2024-03-17 HISTORY — PX: CATARACT EXTRACTION W/PHACO: SHX586

## 2024-03-17 HISTORY — DX: Other fatigue: R53.83

## 2024-03-17 SURGERY — PHACOEMULSIFICATION, CATARACT, WITH IOL INSERTION
Anesthesia: Monitor Anesthesia Care | Site: Eye | Laterality: Left

## 2024-03-17 MED ORDER — ONDANSETRON HCL 4 MG/2ML IJ SOLN: 4.0000 mg | Freq: Once | INTRAMUSCULAR | Status: DC | PRN

## 2024-03-17 MED ORDER — PHENYLEPHRINE HCL 10 % OP SOLN
OPHTHALMIC | Status: AC
  Filled 2024-03-17: qty 5

## 2024-03-17 MED ORDER — MIDAZOLAM HCL (PF) 2 MG/2ML IJ SOLN
INTRAMUSCULAR | Status: DC | PRN
  Administered 2024-03-17: 2 mg via INTRAVENOUS

## 2024-03-17 MED ORDER — PHENYLEPHRINE HCL 10 % OP SOLN
1.0000 [drp] | OPHTHALMIC | Status: AC
  Administered 2024-03-17 (×3): 1 [drp] via OPHTHALMIC

## 2024-03-17 MED ORDER — FENTANYL CITRATE (PF) 100 MCG/2ML IJ SOLN
INTRAMUSCULAR | Status: AC
  Filled 2024-03-17: qty 2

## 2024-03-17 MED ORDER — TETRACAINE HCL 0.5 % OP SOLN
OPHTHALMIC | Status: AC
  Filled 2024-03-17: qty 4

## 2024-03-17 MED ORDER — TETRACAINE HCL 0.5 % OP SOLN
1.0000 [drp] | OPHTHALMIC | Status: DC | PRN
  Administered 2024-03-17 (×2): 1 [drp] via OPHTHALMIC

## 2024-03-17 MED ORDER — LIDOCAINE HCL (PF) 2 % IJ SOLN
INTRAOCULAR | Status: DC | PRN
  Administered 2024-03-17: 2 mL

## 2024-03-17 MED ORDER — FENTANYL CITRATE (PF) 100 MCG/2ML IJ SOLN
INTRAMUSCULAR | Status: DC | PRN
  Administered 2024-03-17: 50 ug via INTRAVENOUS

## 2024-03-17 MED ORDER — MIDAZOLAM HCL 2 MG/2ML IJ SOLN
INTRAMUSCULAR | Status: AC
  Filled 2024-03-17: qty 2

## 2024-03-17 MED ORDER — SIGHTPATH DOSE#1 NA CHONDROIT SULF-NA HYALURON 40-17 MG/ML IO SOLN
INTRAOCULAR | Status: DC | PRN
  Administered 2024-03-17: 1 mL via INTRAOCULAR

## 2024-03-17 MED ORDER — MOXIFLOXACIN HCL 0.5 % OP SOLN
OPHTHALMIC | Status: DC | PRN
  Administered 2024-03-17: .2 mL via OPHTHALMIC

## 2024-03-17 MED ORDER — LACTATED RINGERS IV SOLN
INTRAVENOUS | Status: DC
Start: 2024-03-17 — End: 2024-03-17

## 2024-03-17 MED ORDER — SIGHTPATH DOSE#1 BSS IO SOLN
INTRAOCULAR | Status: DC | PRN
  Administered 2024-03-17: 151 mL via OPHTHALMIC

## 2024-03-17 MED ORDER — SIGHTPATH DOSE#1 BSS IO SOLN
INTRAOCULAR | Status: DC | PRN
  Administered 2024-03-17: 15 mL via INTRAOCULAR

## 2024-03-17 MED ORDER — CYCLOPENTOLATE HCL 2 % OP SOLN
OPHTHALMIC | Status: AC
  Filled 2024-03-17: qty 2

## 2024-03-17 MED ORDER — BRIMONIDINE TARTRATE-TIMOLOL 0.2-0.5 % OP SOLN
OPHTHALMIC | Status: DC | PRN
  Administered 2024-03-17: 1 [drp] via OPHTHALMIC

## 2024-03-17 MED ORDER — CYCLOPENTOLATE HCL 2 % OP SOLN
1.0000 [drp] | OPHTHALMIC | Status: AC
  Administered 2024-03-17 (×3): 1 [drp] via OPHTHALMIC

## 2024-03-17 SURGICAL SUPPLY — 9 items
CYSTOTOME ANGL RVRS SHRT 25G (CUTTER) ×1 IMPLANT
FEE CATARACT SUITE SIGHTPATH (MISCELLANEOUS) ×1 IMPLANT
GLOVE BIOGEL PI IND STRL 8 (GLOVE) ×1 IMPLANT
GLOVE SURG LX STRL 8.0 MICRO (GLOVE) ×1 IMPLANT
GLOVE SURG SYN 6.5 PF PI BL (GLOVE) ×1 IMPLANT
LENS IOL TECNIS EYHANCE 20.5 (Intraocular Lens) IMPLANT
NDL FILTER BLUNT 18X1 1/2 (NEEDLE) ×1 IMPLANT
NEEDLE FILTER BLUNT 18X1 1/2 (NEEDLE) ×1 IMPLANT
SYR 3ML LL SCALE MARK (SYRINGE) ×1 IMPLANT

## 2024-03-17 NOTE — H&P (Signed)
 [facility]   Primary Care Physician:  Joshua Comer LABOR, PA-C Ophthalmologist: Dr. Elsie Carmine  Pre-Procedure History & Physical: HPI:  Brittney Sullivan is a 63 y.o. female here for cataract surgery.   Past Medical History:  Diagnosis Date   Allergic rhinitis    Arthritis    CKD stage 3a, GFR 45-59 ml/min (HCC)    Class 3 severe obesity due to excess calories with serious comorbidity and body mass index (BMI) of 45.0 to 49.9 in adult (HCC)    Fatigue    GERD (gastroesophageal reflux disease)    Hypertension    Insomnia    Irregular heart rhythm    Migraine    RAD (reactive airway disease)    Vitamin D deficiency     Past Surgical History:  Procedure Laterality Date   BRAIN SURGERY  1962   blood vessel on the outside of the brain    Prior to Admission medications   Medication Sig Start Date End Date Taking? Authorizing Provider  amphetamine-dextroamphetamine (ADDERALL XR) 30 MG 24 hr capsule Take 30 mg by mouth daily.   Yes [provider]  lisinopril  (ZESTRIL ) 2.5 MG tablet Take 2.5 mg by mouth daily.   Yes [provider]  metformin (FORTAMET) 500 MG (OSM) 24 hr tablet Take 500 mg by mouth daily with breakfast.   Yes [provider]    Allergies as of 02/26/2024 - Review Complete 02/22/2023  Allergen Reaction Noted   Alprazolam  05/31/2015    Family History  Problem Relation Age of Onset   Hypercholesterolemia Mother    Gout Mother    Cancer Father        lung   Hypertension Father    Gout Brother    Hypertension Son    Healthy Son    Ovarian cysts Sister     Social History   Socioeconomic History   Marital status: Married    Spouse name: Not on file   Number of children: Not on file   Years of education: Not on file   Highest education level: Not on file  Occupational History   Occupation: Merchandiser, Retail    Comment: [employer]  Tobacco Use   Smoking status: Never   Smokeless tobacco: Never  Substance and  Sexual Activity   Alcohol use: No    Alcohol/week: 0.0 standard drinks of alcohol   Drug use: No   Sexual activity: Yes  Other Topics Concern   Not on file  Social History Narrative   Not on file   Social Drivers of Health   Financial Resource Strain: Low Risk  (09/26/2023)   Received from [facility]   Overall Financial Resource Strain (CARDIA)    Difficulty of Paying Living Expenses: Not hard at all  Food Insecurity: No Food Insecurity (09/26/2023)   Received from [facility]   Hunger Vital Sign    Within the past 12 months, you worried that your food would run out before you got the money to buy more.: Never true    Within the past 12 months, the food you bought just didn't last and you didn't have money to get more.: Never true  Transportation Needs: No Transportation Needs (09/26/2023)   Received from [facility]   PRAPARE - Transportation    Lack of Transportation (Medical): No    Lack of Transportation (Non-Medical): No  Physical Activity: Inactive (03/26/2023)   Received from [facility]   Exercise Vital Sign  On average, how many days per week do you engage in moderate to strenuous exercise (like a brisk walk)?: 0 days    On average, how many minutes do you engage in exercise at this level?: 0 min  Stress: Not on file  Social Connections: Not on file  Intimate Partner Violence: Not At Risk (01/16/2023)   Humiliation, Afraid, Rape, and Kick questionnaire    Fear of Current or Ex-Partner: No    Emotionally Abused: No    Physically Abused: No    Sexually Abused: No    Review of Systems: See HPI, otherwise negative ROS  Physical Exam: BP (!) 160/95   Pulse 87   Resp 15   Ht 5' (1.524 m)   Wt 108.9 kg   SpO2 100%   BMI 46.87 kg/m  General:   Alert, cooperative. Head:  Normocephalic and atraumatic. Respiratory:  Normal work of breathing. Cardiovascular:  NAD  Impression/Plan: Brittney Sullivan is here for cataract surgery.  Risks, benefits,  limitations, and alternatives regarding cataract surgery have been reviewed with the patient.  Questions have been answered.  All parties agreeable.   Elsie Carmine, MD  03/17/2024, 8:40 AM

## 2024-03-17 NOTE — Anesthesia Postprocedure Evaluation (Signed)
 Anesthesia Post Note  Patient: PAISLEIGH MARONEY  Procedure(s) Performed: PHACOEMULSIFICATION, CATARACT, WITH IOL INSERTION 70.94 05:00.0 (Left: Eye)  Patient location during evaluation: PACU Anesthesia Type: MAC Level of consciousness: awake and alert, oriented and patient cooperative Pain management: pain level controlled Vital Signs Assessment: post-procedure vital signs reviewed and stable Respiratory status: spontaneous breathing, nonlabored ventilation and respiratory function stable Cardiovascular status: blood pressure returned to baseline and stable Postop Assessment: adequate PO intake Anesthetic complications: no   No notable events documented.   Last Vitals:  Vitals:   03/17/24 0915 03/17/24 0919  BP: (!) 159/85 (!) 155/83  Pulse: 65 65  Resp: 13 18  Temp: 36.4 C   SpO2: 99% 99%    Last Pain:  Vitals:   03/17/24 0919  TempSrc:   PainSc: 2                  Alfonso Ruths

## 2024-03-17 NOTE — Transfer of Care (Signed)
 Immediate Anesthesia Transfer of Care Note  Patient: Brittney Sullivan  Procedure(s) Performed: PHACOEMULSIFICATION, CATARACT, WITH IOL INSERTION 70.94 05:00.0 (Left: Eye)  Patient Location: PACU  Anesthesia Type: MAC  Level of Consciousness: awake, alert  and patient cooperative  Airway and Oxygen Therapy: Patient Spontanous Breathing and Patient connected to supplemental oxygen  Post-op Assessment: Post-op Vital signs reviewed, Patient's Cardiovascular Status Stable, Respiratory Function Stable, Patent Airway and No signs of Nausea or vomiting  Post-op Vital Signs: Reviewed and stable  Complications: No notable events documented.

## 2024-03-17 NOTE — Op Note (Signed)
 PREOPERATIVE DIAGNOSIS:  Nuclear sclerotic cataract of the left eye.   POSTOPERATIVE DIAGNOSIS:  Nuclear sclerotic cataract of the left eye.   OPERATIVE PROCEDURE:ORPROCALL@   SURGEON:  Elsie Carmine, MD.   ANESTHESIA:  Anesthesiologist: Shellie Odor, MD CRNA: Levy Harvey, CRNA  1.      Managed anesthesia care. 2.     0.30ml of Shugarcaine was instilled following the paracentesis   COMPLICATIONS:  None.   TECHNIQUE:   Stop and chop   DESCRIPTION OF PROCEDURE:  The patient was examined and consented in the preoperative holding area where the aforementioned topical anesthesia was applied to the left eye and then brought back to the Operating Room where the left eye was prepped and draped in the usual sterile ophthalmic fashion and a lid speculum was placed. A paracentesis was created with the side port blade and the anterior chamber was filled with viscoelastic. A near clear corneal incision was performed with the steel keratome. A continuous curvilinear capsulorrhexis was performed with a cystotome followed by the capsulorrhexis forceps. Hydrodissection and hydrodelineation were carried out with BSS on a blunt cannula. The lens was removed in a stop and chop  technique and the remaining cortical material was removed with the irrigation-aspiration handpiece. The capsular bag was inflated with viscoelastic and the intraocular lens was placed in the capsular bag without complication. The remaining viscoelastic was removed from the eye with the irrigation-aspiration handpiece. The wounds were hydrated. The anterior chamber was flushed with BSS and the eye was inflated to physiologic pressure. 0.71ml Vigamox was placed in the anterior chamber. The wounds were found to be water tight. The eye was dressed with Combigan. The patient was given protective glasses to wear throughout the day and a shield with which to sleep tonight. The patient was also given drops with which to begin a drop regimen today  and will follow-up with me in one day. Implant Name Type Inv. Item Serial No. Manufacturer Lot No. LRB No. Used Action  LENS IOL TECNIS EYHANCE 20.5 - [id number] Intraocular Lens LENS IOL TECNIS EYHANCE 20.5 [id number] [employer]  Left 1 Implanted    Procedure(s): PHACOEMULSIFICATION, CATARACT, WITH IOL INSERTION 70.94 05:00.0 (Left)  Electronically signed: Elsie Carmine 03/17/2024 9:13 AM

## 2024-03-17 NOTE — Anesthesia Preprocedure Evaluation (Addendum)
 Anesthesia Evaluation  Patient identified by MRN, date of birth, ID band Patient awake    Reviewed: Allergy & Precautions, NPO status , Patient's Chart, lab work & pertinent test results  History of Anesthesia Complications Negative for: history of anesthetic complications  Airway Mallampati: IV   Neck ROM: Full    Dental  (+) Missing   Pulmonary neg pulmonary ROS   Pulmonary exam normal breath sounds clear to auscultation       Cardiovascular hypertension, Normal cardiovascular exam Rhythm:Regular Rate:Normal     Neuro/Psych  Headaches    GI/Hepatic ,GERD  ,,  Endo/Other    Class 3 obesityPrediabetes   Renal/GU Renal disease (stage III CKD)     Musculoskeletal  (+) Arthritis ,    Abdominal   Peds  Hematology negative hematology ROS (+)   Anesthesia Other Findings   Reproductive/Obstetrics                              Anesthesia Physical Anesthesia Plan  ASA: 3  Anesthesia Plan: MAC   Post-op Pain Management:    Induction: Intravenous  PONV Risk Score and Plan: 2 and Treatment may vary due to age or medical condition, Midazolam and TIVA  Airway Management Planned: Natural Airway and Nasal Cannula  Additional Equipment:   Intra-op Plan:   Post-operative Plan:   Informed Consent: I have reviewed the patients History and Physical, chart, labs and discussed the procedure including the risks, benefits and alternatives for the proposed anesthesia with the patient or authorized representative who has indicated his/her understanding and acceptance.     Dental advisory given  Plan Discussed with: CRNA  Anesthesia Plan Comments: (LMA/GETA backup discussed.  Patient consented for risks of anesthesia including but not limited to:  - adverse reactions to medications - damage to eyes, teeth, lips or other oral mucosa - nerve damage due to positioning  - sore throat or  hoarseness - damage to heart, brain, nerves, lungs, other parts of body or loss of life  Informed patient about role of CRNA in peri- and intra-operative care.  Patient voiced understanding.)         Anesthesia Quick Evaluation

## 2024-03-23 ENCOUNTER — Encounter: Payer: Self-pay | Admitting: Ophthalmology

## 2024-03-23 NOTE — Anesthesia Preprocedure Evaluation (Addendum)
 Anesthesia Evaluation  Patient identified by MRN, date of birth, ID band Patient awake    Reviewed: Allergy & Precautions, H&P , NPO status , Patient's Chart, lab work & pertinent test results  Airway Mallampati: IV  TM Distance: >3 FB Neck ROM: Full    Dental no notable dental hx.    Pulmonary neg pulmonary ROS   Pulmonary exam normal breath sounds clear to auscultation       Cardiovascular hypertension, Normal cardiovascular exam Rhythm:Regular Rate:Normal     Neuro/Psych  Headaches negative neurological ROS  negative psych ROS   GI/Hepatic negative GI ROS, Neg liver ROS,GERD  ,,  Endo/Other  negative endocrine ROS    Renal/GU Renal diseasenegative Renal ROS  negative genitourinary   Musculoskeletal negative musculoskeletal ROS (+) Arthritis ,    Abdominal   Peds negative pediatric ROS (+)  Hematology negative hematology ROS (+)   Anesthesia Other Findings Previous cataract surgery 03-17-24 Dr. Mazzoni  Hypertension  Allergic rhinitis Arthritis  Insomnia Vitamin D deficiency  RAD (reactive airway disease) GERD (gastroesophageal reflux disease)  Migraine Irregular heart rhythm  Class 3 severe obesity due to excess calories with serious comorbidity and body mass index (BMI) of 45.0 to 49.9 in adult  CKD stage 3a, GFR 45-59 ml/min  Fatigue Gout  Class 3 severe obesity due to excess calories in adult     Reproductive/Obstetrics negative OB ROS                              Anesthesia Physical Anesthesia Plan  ASA: 3  Anesthesia Plan: MAC   Post-op Pain Management:    Induction: Intravenous  PONV Risk Score and Plan:   Airway Management Planned: Natural Airway and Nasal Cannula  Additional Equipment:   Intra-op Plan:   Post-operative Plan:   Informed Consent: I have reviewed the patients History and Physical, chart, labs and discussed the procedure including the  risks, benefits and alternatives for the proposed anesthesia with the patient or authorized representative who has indicated his/her understanding and acceptance.     Dental Advisory Given  Plan Discussed with: Anesthesiologist, CRNA and Surgeon  Anesthesia Plan Comments: (Patient consented for risks of anesthesia including but not limited to:  - adverse reactions to medications - damage to eyes, teeth, lips or other oral mucosa - nerve damage due to positioning  - sore throat or hoarseness - Damage to heart, brain, nerves, lungs, other parts of body or loss of life  Patient voiced understanding and assent.)         Anesthesia Quick Evaluation

## 2024-03-27 NOTE — Discharge Instructions (Signed)

## 2024-03-31 ENCOUNTER — Ambulatory Visit
Admission: RE | Admit: 2024-03-31 | Discharge: 2024-03-31 | Disposition: A | Discharge status: Home / Self Care | Payer: Self-pay | Attending: Ophthalmology | Admitting: Ophthalmology

## 2024-03-31 ENCOUNTER — Other Ambulatory Visit: Payer: Self-pay

## 2024-03-31 ENCOUNTER — Ambulatory Visit: Payer: Self-pay | Admitting: Anesthesiology

## 2024-03-31 ENCOUNTER — Encounter
Admission: RE | Disposition: A | Discharge status: Home / Self Care | Payer: Self-pay | Source: Home / Self Care | Attending: Ophthalmology

## 2024-03-31 ENCOUNTER — Encounter: Payer: Self-pay | Admitting: Ophthalmology

## 2024-03-31 DIAGNOSIS — M1990 Unspecified osteoarthritis, unspecified site: Secondary | ICD-10-CM | POA: Insufficient documentation

## 2024-03-31 DIAGNOSIS — H2511 Age-related nuclear cataract, right eye: Secondary | ICD-10-CM | POA: Diagnosis present

## 2024-03-31 DIAGNOSIS — I10 Essential (primary) hypertension: Secondary | ICD-10-CM | POA: Insufficient documentation

## 2024-03-31 DIAGNOSIS — K219 Gastro-esophageal reflux disease without esophagitis: Secondary | ICD-10-CM | POA: Diagnosis not present

## 2024-03-31 HISTORY — DX: Obesity, class 3: E66.813

## 2024-03-31 HISTORY — DX: Gout, unspecified: M10.9

## 2024-03-31 HISTORY — PX: CATARACT EXTRACTION W/PHACO: SHX586

## 2024-03-31 SURGERY — PHACOEMULSIFICATION, CATARACT, WITH IOL INSERTION
Anesthesia: Monitor Anesthesia Care | Laterality: Right

## 2024-03-31 MED ORDER — TETRACAINE HCL 0.5 % OP SOLN
OPHTHALMIC | Status: AC
  Filled 2024-03-31: qty 4

## 2024-03-31 MED ORDER — FENTANYL CITRATE (PF) 100 MCG/2ML IJ SOLN
INTRAMUSCULAR | Status: DC | PRN
  Administered 2024-03-31: 50 ug via INTRAVENOUS

## 2024-03-31 MED ORDER — SIGHTPATH DOSE#1 NA CHONDROIT SULF-NA HYALURON 40-17 MG/ML IO SOLN
INTRAOCULAR | Status: DC | PRN
Start: 2024-03-31 — End: 2024-03-31
  Administered 2024-03-31: 1 mL via INTRAOCULAR

## 2024-03-31 MED ORDER — LACTATED RINGERS IV SOLN: INTRAVENOUS | Status: DC

## 2024-03-31 MED ORDER — TETRACAINE HCL 0.5 % OP SOLN
1.0000 [drp] | OPHTHALMIC | Status: DC | PRN
  Administered 2024-03-31 (×3): 1 [drp] via OPHTHALMIC

## 2024-03-31 MED ORDER — FENTANYL CITRATE (PF) 100 MCG/2ML IJ SOLN
INTRAMUSCULAR | Status: AC
  Filled 2024-03-31: qty 2

## 2024-03-31 MED ORDER — MIDAZOLAM HCL (PF) 2 MG/2ML IJ SOLN
INTRAMUSCULAR | Status: DC | PRN
  Administered 2024-03-31: 2 mg via INTRAVENOUS

## 2024-03-31 MED ORDER — MOXIFLOXACIN HCL 0.5 % OP SOLN
OPHTHALMIC | Status: DC | PRN
  Administered 2024-03-31: .2 mL via OPHTHALMIC

## 2024-03-31 MED ORDER — PHENYLEPHRINE HCL 10 % OP SOLN
1.0000 [drp] | OPHTHALMIC | Status: AC
Start: 2024-03-31 — End: 2024-03-31
  Administered 2024-03-31 (×3): 1 [drp] via OPHTHALMIC

## 2024-03-31 MED ORDER — CYCLOPENTOLATE HCL 2 % OP SOLN
OPHTHALMIC | Status: AC
Start: 2024-03-31 — End: 2024-03-31
  Filled 2024-03-31: qty 2

## 2024-03-31 MED ORDER — SIGHTPATH DOSE#1 BSS IO SOLN
INTRAOCULAR | Status: DC | PRN
  Administered 2024-03-31: 15 mL via INTRAOCULAR

## 2024-03-31 MED ORDER — MIDAZOLAM HCL 2 MG/2ML IJ SOLN
INTRAMUSCULAR | Status: AC
  Filled 2024-03-31: qty 2

## 2024-03-31 MED ORDER — CYCLOPENTOLATE HCL 2 % OP SOLN
1.0000 [drp] | OPHTHALMIC | Status: AC
  Administered 2024-03-31 (×3): 1 [drp] via OPHTHALMIC

## 2024-03-31 MED ORDER — BRIMONIDINE TARTRATE-TIMOLOL 0.2-0.5 % OP SOLN
OPHTHALMIC | Status: DC | PRN
  Administered 2024-03-31: 1 [drp] via OPHTHALMIC

## 2024-03-31 MED ORDER — BALANCED SALT IO SOLN
INTRAOCULAR | Status: DC | PRN
  Administered 2024-03-31: 2 mL

## 2024-03-31 MED ORDER — SIGHTPATH DOSE#1 BSS IO SOLN
INTRAOCULAR | Status: DC | PRN
  Administered 2024-03-31: 55 mL via OPHTHALMIC

## 2024-03-31 MED ORDER — PHENYLEPHRINE HCL 10 % OP SOLN
OPHTHALMIC | Status: AC
  Filled 2024-03-31: qty 5

## 2024-03-31 SURGICAL SUPPLY — 10 items
CANNULA ANT/CHMB 27G (MISCELLANEOUS) ×1 IMPLANT
CYSTOTOME ANGL RVRS SHRT 25G (CUTTER) ×1 IMPLANT
FEE CATARACT SUITE SIGHTPATH (MISCELLANEOUS) ×1 IMPLANT
GLOVE BIOGEL PI IND STRL 8 (GLOVE) ×1 IMPLANT
GLOVE SURG LX STRL 8.0 MICRO (GLOVE) ×1 IMPLANT
GLOVE SURG SYN 6.5 PF PI BL (GLOVE) ×1 IMPLANT
LENS IOL TECNIS EYHANCE 19.5 (Intraocular Lens) IMPLANT
NDL FILTER BLUNT 18X1 1/2 (NEEDLE) ×1 IMPLANT
NEEDLE FILTER BLUNT 18X1 1/2 (NEEDLE) ×1 IMPLANT
SYR 3ML LL SCALE MARK (SYRINGE) ×1 IMPLANT

## 2024-03-31 NOTE — Op Note (Signed)
 PREOPERATIVE DIAGNOSIS:  Nuclear sclerotic cataract of the right eye.   POSTOPERATIVE DIAGNOSIS:  Right Eye Cataract   OPERATIVE PROCEDURE:ORPROCALL@   SURGEON:  Elsie Carmine, MD.   ANESTHESIA:  Anesthesiologist: Ola Donny BROCKS, MD CRNA: Myra Lawless, CRNA  1.      Managed anesthesia care. 2.      0.7ml of Shugarcaine was instilled in the eye following the paracentesis.   COMPLICATIONS:  None.   TECHNIQUE:   Stop and chop   DESCRIPTION OF PROCEDURE:  The patient was examined and consented in the preoperative holding area where the aforementioned topical anesthesia was applied to the right eye and then brought back to the Operating Room where the right eye was prepped and draped in the usual sterile ophthalmic fashion and a lid speculum was placed. A paracentesis was created with the side port blade and the anterior chamber was filled with viscoelastic. A near clear corneal incision was performed with the steel keratome. A continuous curvilinear capsulorrhexis was performed with a cystotome followed by the capsulorrhexis forceps. Hydrodissection and hydrodelineation were carried out with BSS on a blunt cannula. The lens was removed in a stop and chop  technique and the remaining cortical material was removed with the irrigation-aspiration handpiece. The capsular bag was inflated with viscoelastic and the intraocular lens was placed in the capsular bag without complication. The remaining viscoelastic was removed from the eye with the irrigation-aspiration handpiece. The wounds were hydrated. The anterior chamber was flushed with BSS and the eye was inflated to physiologic pressure. 0.1ml of Vigamox was placed in the anterior chamber. The wounds were found to be water tight. The eye was dressed with Combigan. The patient was given protective glasses to wear throughout the day and a shield with which to sleep tonight. The patient was also given drops with which to begin a drop regimen today  and will follow-up with me in one day. Implant Name Type Inv. Item Serial No. Manufacturer Lot No. LRB No. Used Action  LENS IOL TECNIS EYHANCE 19.5 - [id number] Intraocular Lens LENS IOL TECNIS EYHANCE 19.5 [id number] [employer]  Right 1 Implanted   Procedure(s): PHACOEMULSIFICATION, CATARACT, WITH IOL INSERTION 10.53 00:51.2 (Right)  Electronically signed: Elsie Carmine 03/31/2024 12:12 PM

## 2024-03-31 NOTE — H&P (Signed)
 Brittney [facility]   Primary Care Physician:  Joshua Comer LABOR, PA-C Ophthalmologist: Dr. Elsie Carmine  Pre-Procedure History & Physical: HPI:  Brittney Sullivan is a 63 y.o. female here for cataract surgery.   Past Medical History:  Diagnosis Date   Allergic rhinitis    Arthritis    CKD stage 3a, GFR 45-59 ml/min (HCC)    Class 3 severe obesity due to excess calories in adult [facility])    Class 3 severe obesity due to excess calories with serious comorbidity and body mass index (BMI) of 45.0 to 49.9 in adult [facility])    Fatigue    GERD (gastroesophageal reflux disease)    Gout    Hypertension    Insomnia    Irregular heart rhythm    Migraine    RAD (reactive airway disease)    Vitamin D deficiency     Past Surgical History:  Procedure Laterality Date   BRAIN SURGERY  1962   blood vessel on the outside of the brain   CATARACT EXTRACTION W/PHACO Left 03/17/2024   Procedure: PHACOEMULSIFICATION, CATARACT, WITH IOL INSERTION 70.94 05:00.0;  Surgeon: Carmine Elsie, MD;  Location[facility];  Service: Ophthalmology;  Laterality: Left;    Prior to Admission medications   Medication Sig Start Date End Date Taking? Authorizing Provider  amphetamine-dextroamphetamine (ADDERALL XR) 30 MG 24 hr capsule Take 30 mg by mouth daily.   Yes [provider]  lisinopril  (ZESTRIL ) 2.5 MG tablet Take 2.5 mg by mouth daily.   Yes [provider]  metformin (FORTAMET) 500 MG (OSM) 24 hr tablet Take 500 mg by mouth daily with breakfast.   Yes [provider]    Allergies as of 02/26/2024 - Review Complete 02/22/2023  Allergen Reaction Noted   Alprazolam  05/31/2015    Family History  Problem Relation Age of Onset   Hypercholesterolemia Mother    Gout Mother    Cancer Father        lung   Hypertension Father    Gout Brother    Hypertension Son    Healthy Son    Ovarian cysts Sister     Social History   Socioeconomic History   Marital status:  Married    Spouse name: Not on file   Number of children: Not on file   Years of education: Not on file   Highest education level: Not on file  Occupational History   Occupation: Merchandiser, Retail    Comment: [employer] [facility]  Tobacco Use   Smoking status: Never   Smokeless tobacco: Never  Substance and Sexual Activity   Alcohol use: No    Alcohol/week: 0.0 standard drinks of alcohol   Drug use: No   Sexual activity: Yes  Other Topics Concern   Not on file  Social History Narrative   Not on file   Social Drivers of Health   Financial Resource Strain: Low Risk (09/26/2023)   [facility]   Overall Financial Resource Strain (CARDIA)    Difficulty of Paying Living Expenses: Not hard at all  Food Insecurity: No Food Insecurity (09/26/2023)   Received from [facility]   Hunger Vital Sign    Within the past 12 months, you worried that your food would run out before you got the money to buy more.: Never true    Within the past 12 months, the food you bought just didn't last and you didn't have money to get more.: Never true  Transportation Needs:  No Transportation Needs (09/26/2023)   Received from [facility]    Lack of Transportation (Medical): No    Lack of Transportation (Non-Medical): No  Physical Activity: Inactive (03/26/2023)   Received from [facility]   Exercise Vital Sign    On average, how many days per week do you engage in moderate to strenuous exercise (like a brisk walk)?: 0 days    On average, how many minutes do you engage in exercise at this level?: 0 min  Stress: Not on file  Social Connections: Not on file  Intimate Partner Violence: Not At Risk (01/16/2023)   Humiliation, Afraid, Rape, and Kick questionnaire    Fear of Current or Ex-Partner: No    Emotionally Abused: No    Physically Abused: No    Sexually Abused: No    Review of Systems: See HPI, otherwise negative ROS  Physical Exam: BP (!) 189/86    Pulse 74   Temp 98.2 F (36.8 C) (Temporal)   Resp 18   Ht 5' (1.524 m)   Wt 114.6 kg   SpO2 99%   BMI 49.33 kg/m  General:   Alert, cooperative. Head:  Normocephalic and atraumatic. Respiratory:  Normal work of breathing. Cardiovascular:  NAD  Impression/Plan: Brittney Sullivan is here for cataract surgery.  Risks, benefits, limitations, and alternatives regarding cataract surgery have been reviewed with the patient.  Questions have been answered.  All parties agreeable.   Elsie Carmine, MD  03/31/2024, 11:43 AM

## 2024-03-31 NOTE — Anesthesia Postprocedure Evaluation (Signed)
 Anesthesia Post Note  Patient: Brittney [facility]  Procedure(s) Performed: PHACOEMULSIFICATION, CATARACT, WITH IOL INSERTION 10.53 00:51.2 (Right)  Patient location during evaluation: PACU Anesthesia Type: MAC Level of consciousness: awake and alert Pain management: pain level controlled Vital Signs Assessment: post-procedure vital signs reviewed and stable Respiratory status: spontaneous breathing, nonlabored ventilation, respiratory function stable and patient connected to nasal cannula oxygen Cardiovascular status: stable and blood pressure returned to baseline Postop Assessment: no apparent nausea or vomiting Anesthetic complications: no   No notable events documented.   Last Vitals:  Vitals:   03/31/24 1217 03/31/24 1219  BP: (!) 160/77   Pulse: 70 64  Resp: 20 17  Temp:    SpO2: 100% 99%    Last Pain:  Vitals:   03/31/24 1217  TempSrc:   PainSc: 0-No pain                 Chiante Peden C Cougar Imel

## 2024-03-31 NOTE — Transfer of Care (Signed)
 Immediate Anesthesia Transfer of Care Note  Patient: Brittney [facility]  Procedure(s) Performed: PHACOEMULSIFICATION, CATARACT, WITH IOL INSERTION 10.53 00:51.2 (Right)  Patient Location: PACU  Anesthesia Type: MAC  Level of Consciousness: awake, alert  and patient cooperative  Airway and Oxygen Therapy: Patient Spontanous Breathing and Patient connected to supplemental oxygen  Post-op Assessment: Post-op Vital signs reviewed, Patient's Cardiovascular Status Stable, Respiratory Function Stable, Patent Airway and No signs of Nausea or vomiting  Post-op Vital Signs: Reviewed and stable  Complications: No notable events documented.
